# Patient Record
Sex: Female | Born: 1955
Health system: Southern US, Community
[De-identification: ages and names within clinical notes are randomized; demographics above are authoritative.]

## PROBLEM LIST (undated history)

## (undated) DIAGNOSIS — T7840XA Allergy, unspecified, initial encounter: Secondary | ICD-10-CM

## (undated) DIAGNOSIS — K219 Gastro-esophageal reflux disease without esophagitis: Secondary | ICD-10-CM

## (undated) DIAGNOSIS — Z860100 Personal history of colon polyps, unspecified: Secondary | ICD-10-CM

## (undated) DIAGNOSIS — C73 Malignant neoplasm of thyroid gland: Secondary | ICD-10-CM

## (undated) HISTORY — DX: Personal history of colon polyps, unspecified: Z86.0100

## (undated) HISTORY — PX: UPPER GI ENDOSCOPY: SHX6162

## (undated) HISTORY — PX: COLONOSCOPY: SHX174

## (undated) HISTORY — DX: Malignant neoplasm of thyroid gland: C73

## (undated) HISTORY — DX: Personal history of colonic polyps: Z86.010

## (undated) HISTORY — DX: Gastro-esophageal reflux disease without esophagitis: K21.9

## (undated) HISTORY — DX: Allergy, unspecified, initial encounter: T78.40XA

---

## 1991-12-22 HISTORY — PX: THYROID SURGERY: SHX805

## 2000-05-25 ENCOUNTER — Other Ambulatory Visit: Admission: RE | Admit: 2000-05-25 | Discharge: 2000-05-25 | Payer: Self-pay | Admitting: Obstetrics and Gynecology

## 2000-09-21 ENCOUNTER — Encounter: Admission: RE | Admit: 2000-09-21 | Discharge: 2000-09-21 | Payer: Self-pay | Admitting: *Deleted

## 2001-04-21 ENCOUNTER — Encounter: Admission: RE | Admit: 2001-04-21 | Discharge: 2001-04-21 | Payer: Self-pay | Admitting: Endocrinology

## 2001-04-21 ENCOUNTER — Encounter: Payer: Self-pay | Admitting: Endocrinology

## 2002-11-07 ENCOUNTER — Other Ambulatory Visit: Admission: RE | Admit: 2002-11-07 | Discharge: 2002-11-07 | Payer: Self-pay | Admitting: Obstetrics and Gynecology

## 2003-01-01 ENCOUNTER — Ambulatory Visit (HOSPITAL_COMMUNITY): Admission: RE | Admit: 2003-01-01 | Discharge: 2003-01-01 | Payer: Self-pay | Admitting: Internal Medicine

## 2003-01-03 ENCOUNTER — Encounter (HOSPITAL_COMMUNITY): Admission: RE | Admit: 2003-01-03 | Discharge: 2003-04-03 | Payer: Self-pay | Admitting: Internal Medicine

## 2003-01-05 ENCOUNTER — Encounter (HOSPITAL_BASED_OUTPATIENT_CLINIC_OR_DEPARTMENT_OTHER): Payer: Self-pay | Admitting: Internal Medicine

## 2003-03-12 ENCOUNTER — Ambulatory Visit (HOSPITAL_COMMUNITY): Admission: RE | Admit: 2003-03-12 | Discharge: 2003-03-12 | Payer: Self-pay | Admitting: Obstetrics and Gynecology

## 2003-03-12 ENCOUNTER — Encounter (INDEPENDENT_AMBULATORY_CARE_PROVIDER_SITE_OTHER): Payer: Self-pay

## 2004-02-20 ENCOUNTER — Other Ambulatory Visit: Admission: RE | Admit: 2004-02-20 | Discharge: 2004-02-20 | Payer: Self-pay | Admitting: Obstetrics and Gynecology

## 2005-04-22 ENCOUNTER — Other Ambulatory Visit: Admission: RE | Admit: 2005-04-22 | Discharge: 2005-04-22 | Payer: Self-pay | Admitting: Obstetrics and Gynecology

## 2006-10-21 ENCOUNTER — Ambulatory Visit (HOSPITAL_COMMUNITY): Admission: RE | Admit: 2006-10-21 | Discharge: 2006-10-21 | Payer: Self-pay | Admitting: *Deleted

## 2007-03-28 ENCOUNTER — Encounter (HOSPITAL_COMMUNITY): Admission: RE | Admit: 2007-03-28 | Discharge: 2007-04-01 | Payer: Self-pay | Admitting: Internal Medicine

## 2008-01-12 ENCOUNTER — Encounter: Admission: RE | Admit: 2008-01-12 | Discharge: 2008-01-12 | Payer: Self-pay | Admitting: Obstetrics and Gynecology

## 2008-12-15 ENCOUNTER — Emergency Department (HOSPITAL_COMMUNITY): Admission: EM | Admit: 2008-12-15 | Discharge: 2008-12-15 | Payer: Self-pay | Admitting: Family Medicine

## 2009-02-20 ENCOUNTER — Ambulatory Visit (HOSPITAL_COMMUNITY): Admission: RE | Admit: 2009-02-20 | Discharge: 2009-02-20 | Payer: Self-pay | Admitting: *Deleted

## 2009-02-20 ENCOUNTER — Encounter (INDEPENDENT_AMBULATORY_CARE_PROVIDER_SITE_OTHER): Payer: Self-pay | Admitting: *Deleted

## 2009-03-13 ENCOUNTER — Encounter: Admission: RE | Admit: 2009-03-13 | Discharge: 2009-03-13 | Payer: Self-pay | Admitting: Obstetrics and Gynecology

## 2009-07-30 ENCOUNTER — Encounter: Admission: RE | Admit: 2009-07-30 | Discharge: 2009-07-30 | Payer: Self-pay | Admitting: Internal Medicine

## 2009-09-30 ENCOUNTER — Encounter (HOSPITAL_COMMUNITY): Admission: RE | Admit: 2009-09-30 | Discharge: 2009-10-04 | Payer: Self-pay | Admitting: Endocrinology

## 2010-02-05 ENCOUNTER — Encounter: Admission: RE | Admit: 2010-02-05 | Discharge: 2010-02-05 | Payer: Self-pay | Admitting: Internal Medicine

## 2010-02-24 ENCOUNTER — Encounter (INDEPENDENT_AMBULATORY_CARE_PROVIDER_SITE_OTHER): Payer: Self-pay | Admitting: *Deleted

## 2010-02-26 ENCOUNTER — Ambulatory Visit: Payer: Self-pay | Admitting: Gastroenterology

## 2010-03-12 ENCOUNTER — Ambulatory Visit: Payer: Self-pay | Admitting: Gastroenterology

## 2010-03-14 ENCOUNTER — Encounter: Payer: Self-pay | Admitting: Gastroenterology

## 2010-03-18 ENCOUNTER — Encounter: Admission: RE | Admit: 2010-03-18 | Discharge: 2010-03-18 | Payer: Self-pay | Admitting: Obstetrics and Gynecology

## 2010-08-04 ENCOUNTER — Encounter: Admission: RE | Admit: 2010-08-04 | Discharge: 2010-08-04 | Payer: Self-pay | Admitting: Internal Medicine

## 2010-11-03 ENCOUNTER — Encounter (HOSPITAL_COMMUNITY)
Admission: RE | Admit: 2010-11-03 | Discharge: 2011-01-20 | Payer: Self-pay | Source: Home / Self Care | Attending: Endocrinology | Admitting: Endocrinology

## 2011-01-11 ENCOUNTER — Encounter: Payer: Self-pay | Admitting: Obstetrics and Gynecology

## 2011-01-11 ENCOUNTER — Encounter: Payer: Self-pay | Admitting: Internal Medicine

## 2011-01-22 NOTE — Letter (Signed)
Summary: [facility] Instructions  [facility]  [address] [place], [place] [address]   Phone: [phone]  Fax: [phone]       Brianna Mcintyre    1956-07-27    MRN: [id number]        Procedure Day Dorna Bloom:  Wednesday 03/12/2010     Arrival Time: 8:00 am      Procedure Time: 9:00 am     Location of Procedure:                    _x _   Endoscopy Center (4th Floor)                        PREPARATION FOR COLONOSCOPY WITH MOVIPREP   Starting 5 days prior to your procedure Friday 3/18 do not eat nuts, seeds, popcorn, corn, beans, peas,  salads, or any raw vegetables.  Do not take any fiber supplements (e.g. Metamucil, Citrucel, and Benefiber).  THE DAY BEFORE YOUR PROCEDURE         DATE: Tuesday 3/22  1.  Drink clear liquids the entire day-NO SOLID FOOD  2.  Do not drink anything colored red or purple.  Avoid juices with pulp.  No orange juice.  3.  Drink at least 64 oz. (8 glasses) of fluid/clear liquids during the day to prevent dehydration and help the prep work efficiently.  CLEAR LIQUIDS INCLUDE: Water Jello Ice Popsicles Tea (sugar ok, no milk/cream) Powdered fruit flavored drinks Coffee (sugar ok, no milk/cream) Gatorade Juice: apple, white grape, white cranberry  Lemonade Clear bullion, consomm, broth Carbonated beverages (any kind) Strained chicken noodle soup Hard Candy                             4.  In the morning, mix first dose of MoviPrep solution:    Empty 1 Pouch A and 1 Pouch B into the disposable container    Add lukewarm drinking water to the top line of the container. Mix to dissolve    Refrigerate (mixed solution should be used within 24 hrs)  5.  Begin drinking the prep at 5:00 p.m. The MoviPrep container is divided by 4 marks.   Every 15 minutes drink the solution down to the next mark (approximately 8 oz) until the full liter is complete.   6.  Follow completed prep with 16 oz of clear liquid of your choice (Nothing  red or purple).  Continue to drink clear liquids until bedtime.  7.  Before going to bed, mix second dose of MoviPrep solution:    Empty 1 Pouch A and 1 Pouch B into the disposable container    Add lukewarm drinking water to the top line of the container. Mix to dissolve    Refrigerate  THE DAY OF YOUR PROCEDURE      DATE: Wednesday 3/23  Beginning at 4:00 a.m. (5 hours before procedure):         1. Every 15 minutes, drink the solution down to the next mark (approx 8 oz) until the full liter is complete.  2. Follow completed prep with 16 oz. of clear liquid of your choice.    3. You may drink clear liquids until 7:00 am (2 HOURS BEFORE PROCEDURE).   MEDICATION INSTRUCTIONS  Unless otherwise instructed, you should take regular prescription medications with a small sip of water   as early as possible the morning of  your procedure.           OTHER INSTRUCTIONS  You will need a responsible adult at least 53 years of age to accompany you and drive you home.   This person must remain in the waiting room during your procedure.  Wear loose fitting clothing that is easily removed.  Leave jewelry and other valuables at home.  However, you may wish to bring a book to read or  an iPod/MP3 player to listen to music as you wait for your procedure to start.  Remove all body piercing jewelry and leave at home.  Total time from sign-in until discharge is approximately 2-3 hours.  You should go home directly after your procedure and rest.  You can resume normal activities the  day after your procedure.  The day of your procedure you should not:   Drive   Make legal decisions   Operate machinery   Drink alcohol   Return to work  You will receive specific instructions about eating, activities and medications before you leave.    The above instructions have been reviewed and explained to me by   Ezra Sites RN  February 26, 2010 8:12 AM     I fully understand and can  verbalize these instructions _____________________________ Date _________

## 2011-01-22 NOTE — Procedures (Signed)
Summary: Colonoscopy  Patient: Brianna Mcintyre Note: All result statuses are Final unless otherwise noted.  Tests: (1) Colonoscopy (COL)   COL Colonoscopy           DONE     [facility]     [address]     [place], [place]  [address]           COLONOSCOPY PROCEDURE REPORT           PATIENT:  Shelbylynn, Walczyk  MR#:  [id number]     BIRTHDATE:  [birth year], 53 yrs. old  GENDER:  female     ENDOSCOPIST:  Rachael Fee, MD     Referred by:  Juline Patch, M.D.     PROCEDURE DATE:  03/12/2010     PROCEDURE:  Colonoscopy with biopsy     ASA CLASS:  Class II     INDICATIONS:  had two polyps removed 1 year ago by Dr. Virginia Rochester;     shape/size not documented; both were adenomatous, the polyp in     rectum had focal HGD, she was told to have repeat colonoscopy at 1     year interval     MEDICATIONS:   Fentanyl 62.5 mcg IV, Versed 7 mg IV           DESCRIPTION OF PROCEDURE:   After the risks benefits and     alternatives of the procedure were thoroughly explained, informed     consent was obtained.  Digital rectal exam was performed and     revealed no rectal masses.   The LB CF-H180AL [id number] endoscope     was introduced through the anus and advanced to the cecum, which     was identified by both the appendix and ileocecal valve, without     limitations.  The quality of the prep was excellent, using     MoviPrep.  The instrument was then slowly withdrawn as the colon     was fully examined.     <<PROCEDUREIMAGES>>     FINDINGS:  Several small, flat, hyperplastic appearing     recto-sigmoid polyps noted. These were sampled with forceps and     sent to pathology (jar 1) (see image3).  Site of rectal polyp     resection was clearly located in proximal rectum (evidence of scar     noted), this site was clear of residual/recurrent adenomatous     mucosa (see image4).  This was otherwise a normal examination of     the colon (see image5, image2, and image1).   Retroflexed views in     the  rectum revealed no abnormalities.    The scope was then     withdrawn from the patient and the procedure completed.     COMPLICATIONS:  None           ENDOSCOPIC IMPRESSION:     1) Several small hyperplastic appearing rectosigmoid polyps,     sampled with forceps, sent to pathology     2) Site of previous rectal polypectomy located, was clear of     residual/recurrent adenomatous mucosa     3) Otherwise normal examination           RECOMMENDATIONS:     Await final biopsies, likely will recommend repeat colonoscopy     at 3 year interval given HGD in polyp that was removed 1 year ago     by Dr. Virginia Rochester.  ______________________________     Rachael Fee, MD           n.     eSIGNED:   Rachael Fee at 03/12/2010 09:29 AM           Okey Regal, [id number]  Note: An exclamation mark (!) indicates a result that was not dispersed into the flowsheet. Document Creation Date: 03/12/2010 9:30 AM _______________________________________________________________________  (1) Order result status: Final Collection or observation date-time: 03/12/2010 09:20 Requested date-time:  Receipt date-time:  Reported date-time:  Referring Physician:   Ordering Physician: Rob Bunting [phone]) Specimen Source:  Source: Launa Grill Order Number: [id number] Lab site:   Appended Document: Colonoscopy recall     Procedures Next Due Date:    Colonoscopy: 02/2013

## 2011-01-22 NOTE — Miscellaneous (Signed)
Summary: [facility]  Clinical Lists Changes  Medications: Added new medication of MOVIPREP 100 GM  SOLR (PEG-KCL-NACL-NASULF-NA ASC-C) As per prep instructions. - Signed Rx of MOVIPREP 100 GM  SOLR (PEG-KCL-NACL-NASULF-NA ASC-C) As per prep instructions.;  #1 x 0;  Signed;  Entered by: Ezra Sites RN;  Authorized by: Rachael Fee MD;  Method used: Electronically to [facility] [address] [place], [place]  [address], Ph: [phone] or [phone], Fax: [phone] Allergies: Added new allergy or adverse reaction of CODEINE Observations: Added new observation of NKA: F (02/26/2010 7:37)    Prescriptions: MOVIPREP 100 GM  SOLR (PEG-KCL-NACL-NASULF-NA ASC-C) As per prep instructions.  #1 x 0   Entered by:   Ezra Sites RN   Authorized by:   Rachael Fee MD   Signed by:   Ezra Sites RN on 02/26/2010   Method used:   Electronically to        [facility] (retail)       [address]       [place], [place]  [address]       Ph: [phone] or [phone]       Fax: [phone]   RxID:   [id number]

## 2011-01-22 NOTE — Letter (Signed)
Summary: Results Letter  [facility]  [address] [place], [place] [address]   Phone: [phone]  Fax: [phone]        March 14, 2010 MRN: [id number]    [facility] [address] [place], [place]  [address]     Dear Ms. Deisher,    Good news.  The polyp(s) that were removed during your recent procedure were NOT pre-cancerous.  However given your previous pre-cancerous polyps removed in 2010 by Dr. Virginia Rochester, we will put your information in our reminder system and will contact you in 3 years to schedule a repeat procedure.  Please call if you have any questions or concerns.     Sincerely,  Rachael Fee MD  This letter has been electronically signed by your physician.  Appended Document: Results Letter Letter mailed 3.25.11

## 2011-05-05 NOTE — Op Note (Signed)
[name], Brianna Mcintyre                 ACCOUNT NO.:  [id number]   MEDICAL RECORD NO.:  [id number]          PATIENT TYPE:  AMB   LOCATION:  ENDO                         FACILITY:  [facility]   PHYSICIAN:  Georgiana Spinner, M.D.    DATE OF BIRTH:  [birth year]   DATE OF PROCEDURE:  02/20/2009  DATE OF DISCHARGE:                               OPERATIVE REPORT   PROCEDURE:  Colonoscopy.   INDICATIONS:  Colon polyps, colon cancer screening.   ANESTHESIA:  Fentanyl 100 mcg, Versed 12 mg.   PROCEDURE:  With the patient mildly sedated in the left lateral  decubitus position, the Pentax videoscopic pediatric colonoscope was  inserted in the rectum and passed under direct vision with pressure  applied to reach the cecum, identified by ileocecal valve and  appendiceal orifice, both of which were photographed.  From this point  the colonoscope was slowly withdrawn taking circumferential views of  colonic mucosa, stopping at 40 cm from anal verge at which point a polyp  was seen, photographed and removed using snare cautery technique setting  of 20/150 blended current.  The polyp was retrieved by suctioning it  through the endoscope into a tissue trap.  We then further withdrew the  colonoscope taking circumferential views of remaining colonic mucosa as  we withdrew to the rectum where a second polyp was seen.  It too was  photographed and removed using snare cautery technique with the same  setting and it too was removed by suctioning into the end of the  endoscope and pulling it through for retrieval.  The endoscope was  reinserted to this point and drawn back to the rectum which appeared  normal on direct and normal on retroflexed view as well.  The endoscope  was straightened and withdrawn.  The patient's vital signs, pulse  oximeter remained stable.  The patient tolerated procedure well without  apparent complications.   FINDINGS:  Polyp of 40 cm from anal verge and in the rectum proximally.  Await  biopsy report.  The patient will call me for results and follow up  with me as an outpatient.           ______________________________  Georgiana Spinner, M.D.     GMO/MEDQ  D:  02/20/2009  T:  02/20/2009  Job:  [id number]

## 2011-05-08 NOTE — Op Note (Signed)
   NAME:  Brianna Mcintyre, Brianna Mcintyre                           ACCOUNT NO.:  [id number]   MEDICAL RECORD NO.:  [id number]                   PATIENT TYPE:  AMB   LOCATION:  SDC                                  FACILITY:  [facility]   PHYSICIAN:  Juluis Mire, M.D.                DATE OF BIRTH:  1956-09-06   DATE OF PROCEDURE:  03/12/2003  DATE OF DISCHARGE:                                 OPERATIVE REPORT   PREOPERATIVE DIAGNOSES:  Postmenopausal bleeding.   POSTOPERATIVE DIAGNOSES:  Postmenopausal bleeding.   OPERATIVE PROCEDURE:  1. Hysteroscopy.  2. Endometrial biopsies.  3. Endometrial curettings.   SURGEON:  Juluis Mire, M.D.   ANESTHESIA:  General.   ESTIMATED BLOOD LOSS:  Minimal.   PACKS AND DRAINS:  None.   INTRAOPERATIVE BLOOD PLACED:  None.   COMPLICATIONS:  None.   INDICATIONS:  Dictated in history and physical.   PROCEDURE:  The patient was taken to OR and placed in supine position.  After a satisfactory level of general anesthesia obtained, the patient was  placed in the dorsal lithotomy position using Allen stirrups.  The abdomen,  perineum, and vagina were prepped out with Hibiclens.  The patient was then  draped out in sterile field.  Speculum was placed in the vaginal vault.  The  cervix grasped with single tooth tenaculum.  Uterus sounded approximately 8  cm.  Cervix serially dilated to a size 37 Pratt dilator.  Operative  hysteroscope was introduced.  Visualization revealed normal endometrial  findings.  There were no polyps, excrescences, or solid areas.  Multiple  endometrial biopsies were obtained from the anterior, posterior, and lateral  walls.  Endometrial curettings were also obtained.  There was no perforation  or signs of complications.  No active bleeding was encountered.  Single  tooth tenaculum and speculum then removed.  The patient taken out of the  dorsal lithotomy.  Once alert and extubated transferred to recovery room in  good condition.  Sponge,  instrument, needle count reported as correct by  circulating nurse.                                               Juluis Mire, M.D.    JSM/MEDQ  D:  03/12/2003  T:  03/12/2003  Job:  [id number]

## 2011-05-08 NOTE — H&P (Signed)
NAME:  Brianna Mcintyre, Brianna Mcintyre                           ACCOUNT NO.:  [id number]   MEDICAL RECORD NO.:  [id number]                   PATIENT TYPE:  AMB   LOCATION:  SDC                                  FACILITY:  [facility]   PHYSICIAN:  Juluis Mire, M.D.                DATE OF BIRTH:  June 12, 1956   DATE OF ADMISSION:  DATE OF DISCHARGE:                                HISTORY & PHYSICAL   HISTORY OF PRESENT ILLNESS:  A 55 year old nulligravida female who presents  for hysteroscopic evaluation.   In relation to the present admission patient has been troubled with  postmenopausal bleeding.  Her FSHs have been elevated consistent with  menopausal status.  She has reported recurrent bleeding episodes.  We  attempted a saline infusion ultrasound here in the office and biopsy.  On  ultrasound everything appeared to be normal.  We were unable to do the  saline infusion or endometrial sampling due to cervical stenosis.  In view  of this we have set up hysteroscopic evaluation to rule out any type of  endometrial pathology.   ALLERGIES:  CODEINE and BIAXIN.   MEDICATIONS:  Synthroid.   PAST MEDICAL HISTORY:  Usual childhood diseases without significant  sequelae.  Did have a history of thyroid cancer with partial thyroidectomy  in 1993.  Did receive radiation after and is presently on Synthroid  replacement with no evidence of recurrent disease.  She has had no other  surgical or obstetrical history.   FAMILY HISTORY:  Paternal grandfather had a history of hypertension as well  as emphysema.  Brother has a history of kidney problems.  There is one  maternal grandmother with breast cancer.   SOCIAL HISTORY:  No tobacco or alcohol use.   REVIEW OF SYSTEMS:  Noncontributory.   PHYSICAL EXAMINATION:  VITAL SIGNS:  The patient is afebrile with stable  vital signs.  HEENT:  The patient is normocephalic.  Pupils are equal, round, and reactive  to light and accommodation.  Extraocular movements are  intact.  Sclerae and  conjunctivae clear.  Oropharynx clear.  NECK:  Without thyromegaly.  BREASTS:  No discreet masses.  LUNGS:  Clear.  CARDIOVASCULAR:  Regular rhythm, rate without murmurs or gallops.  ABDOMEN:  Benign.  No mass, organomegaly, or tenderness.  PELVIC:  Normal external genitalia.  Vaginal mucosa is clear.  Cervix  unremarkable.  Uterus normal size, shape, and contour.  Adnexa free of  masses or tenderness.  EXTREMITIES:  Trace edema.  NEUROLOGIC:  Grossly within normal limits.   IMPRESSION:  Postmenopausal bleeding, rule out endometrial pathology.   PLAN:  The patient will undergo hysteroscopic evaluation with resectoscope.  We have discussed the indications to rule out endometrial pathology.  The  risks of surgery have been discussed including the risk of infection, risk  of  vascular injury that could lead to hemorrhage requiring transfusion or  possible  hysterectomy, risk of injury to adjacent organs through perforation  that could require laparoscopy, possible exploratory laparotomy, risk of  deep venous thrombosis, and pulmonary embolus.  The patient expressed  understanding of the indications and risks.                                               Juluis Mire, M.D.    JSM/MEDQ  D:  03/12/2003  T:  03/12/2003  Job:  [id number]

## 2011-05-08 NOTE — Op Note (Signed)
[name], Brianna Mcintyre                 ACCOUNT NO.:  [id number]   MEDICAL RECORD NO.:  [id number]          PATIENT TYPE:  AMB   LOCATION:  ENDO                         FACILITY:  [facility]   PHYSICIAN:  Georgiana Spinner, M.D.    DATE OF BIRTH:  1956/07/12   DATE OF PROCEDURE:  10/21/2006  DATE OF DISCHARGE:                                 OPERATIVE REPORT   PROCEDURE:  Upper endoscopy.   INDICATIONS:  GERD.   ANESTHESIA:  Demerol 70 and Versed 8 mg.   PROCEDURE:  With the patient mildly sedated in the left lateral decubitus  position, the Olympus videoscopic endoscope was inserted in the mouth and  passed under direct vision through the esophagus which appeared normal.  There was no evidence of Barrett's esophagus or esophagitis.  We entered  into the stomach.  Fundus, body and antrum, duodenal bulb, second portion of  duodenum were visualized.  From this point, the endoscope was slowly  withdrawn taking circumferential views of duodenal mucosa until the  endoscope had been pulled back into the stomach and placed in retroflexion  to view the stomach from below.  The endoscope was straightened and  withdrawn taking circumferential views of the remaining gastric and  esophageal mucosa.  The patient's vital signs and pulse oximeter remained  stable.  The patient tolerated the procedure well without apparent  complications.   FINDINGS:  Rather unremarkable examination.   PLAN:  Await outpatient patient follow-up.           ______________________________  Georgiana Spinner, M.D.     GMO/MEDQ  D:  10/21/2006  T:  10/21/2006  Job:  [id number]

## 2011-10-13 ENCOUNTER — Other Ambulatory Visit: Payer: Self-pay | Admitting: Obstetrics and Gynecology

## 2011-10-13 DIAGNOSIS — Z1231 Encounter for screening mammogram for malignant neoplasm of breast: Secondary | ICD-10-CM

## 2011-10-27 ENCOUNTER — Ambulatory Visit
Admission: RE | Admit: 2011-10-27 | Discharge: 2011-10-27 | Disposition: A | Discharge status: Home / Self Care | Payer: BC Managed Care – PPO | Source: Ambulatory Visit | Attending: Obstetrics and Gynecology | Admitting: Obstetrics and Gynecology

## 2011-10-27 DIAGNOSIS — Z1231 Encounter for screening mammogram for malignant neoplasm of breast: Secondary | ICD-10-CM

## 2011-10-30 ENCOUNTER — Other Ambulatory Visit: Payer: Self-pay | Admitting: Obstetrics and Gynecology

## 2011-10-30 DIAGNOSIS — R928 Other abnormal and inconclusive findings on diagnostic imaging of breast: Secondary | ICD-10-CM

## 2011-11-10 ENCOUNTER — Ambulatory Visit
Admission: RE | Admit: 2011-11-10 | Discharge: 2011-11-10 | Disposition: A | Discharge status: Home / Self Care | Payer: BC Managed Care – PPO | Source: Ambulatory Visit | Attending: Obstetrics and Gynecology | Admitting: Obstetrics and Gynecology

## 2011-11-10 DIAGNOSIS — R928 Other abnormal and inconclusive findings on diagnostic imaging of breast: Secondary | ICD-10-CM

## 2011-11-17 ENCOUNTER — Other Ambulatory Visit: Payer: BC Managed Care – PPO

## 2012-04-28 ENCOUNTER — Other Ambulatory Visit: Payer: Self-pay | Admitting: Internal Medicine

## 2012-05-03 ENCOUNTER — Ambulatory Visit
Admission: RE | Admit: 2012-05-03 | Discharge: 2012-05-03 | Disposition: A | Discharge status: Home / Self Care | Payer: BC Managed Care – PPO | Source: Ambulatory Visit | Attending: Internal Medicine | Admitting: Internal Medicine

## 2012-05-10 ENCOUNTER — Encounter: Payer: Self-pay | Admitting: Gastroenterology

## 2012-06-07 ENCOUNTER — Encounter: Payer: Self-pay | Admitting: Gastroenterology

## 2012-06-07 ENCOUNTER — Ambulatory Visit (INDEPENDENT_AMBULATORY_CARE_PROVIDER_SITE_OTHER): Payer: BC Managed Care – PPO | Admitting: Gastroenterology

## 2012-06-07 VITALS — BP 112/76 | HR 78 | Ht 63.0 in | Wt 221.4 lb

## 2012-06-07 DIAGNOSIS — R933 Abnormal findings on diagnostic imaging of other parts of digestive tract: Secondary | ICD-10-CM

## 2012-06-07 DIAGNOSIS — R1310 Dysphagia, unspecified: Secondary | ICD-10-CM

## 2012-06-07 NOTE — Patient Instructions (Addendum)
You will be set up for an upper endoscopy, for narrowing on barium esophagus, swallowing problem.

## 2012-06-07 NOTE — Progress Notes (Signed)
Review of pertinent gastrointestinal problems: 1. had two polyps removed 2010 by Dr. Virginia Rochester; shape/size not documented; both were adenomatous, the polyp in rectum had focal HGD, she was told to have repeat colonoscopy at 1 year interval.  Repeat colonoscopy Dr. Christella Hartigan March 2011 found several diminutive hyperplastic polyps. Recommended repeat colonoscopy at 3 year interval.   HPI: This is a  a very pleasant 56 year old woman whom I last saw about 2 years ago. Hard to explain.  She had a quivering senstation in stomach and in throat.  SHe has intermittent epigastric pain, tenderness.  Mentioned the quivering a lot.   Feels a fluttering, quivering sensation.    She went to [facility] and she had a barium esohagram last month. I reviewed the report as well as the images..  SMooth stricture noted on barium esophagram.  She does have intermittent dysphagia. About daily.  Eating smaller bites, chewing well.   Overall her weight has been stable despite attempts to lose.   She has pyrosis, takes aciphex daily with good relief of symptoms.    Cold foods, liquids irritate her stomach.   Review of systems: Pertinent positive and negative review of systems were noted in the above HPI section. Complete review of systems was performed and was otherwise normal.    Past Medical History  Diagnosis Date  . History of colon polyps     No past surgical history on file.  Current Outpatient Prescriptions  Medication Sig Dispense Refill  . levothyroxine (SYNTHROID) 25 MCG tablet Take 25 mcg by mouth daily.      . RABEprazole (ACIPHEX) 20 MG tablet Take 20 mg by mouth daily.        Allergies as of 06/07/2012 - Review Complete 06/07/2012  Allergen Reaction Noted  . Codeine  02/26/2010    No family history on file.  History   Social History  . Marital Status: Divorced    Spouse Name: N/A    Number of Children: 4  . Years of Education: N/A   Occupational History  . DIRECTOR OF SALES     Social History Main Topics  . Smoking status: Never Smoker   . Smokeless tobacco: Never Used  . Alcohol Use: Yes     rarely  . Drug Use: No  . Sexually Active: Not on file   Other Topics Concern  . Not on file   Social History Narrative  . No narrative on file       Physical Exam: BP 112/76  Pulse 78  Ht 5\' 3"  (1.6 m)  Wt 221 lb 6.4 oz (100.426 kg)  BMI 39.22 kg/m2 Constitutional: generally well-appearing Psychiatric: alert and oriented x3 Eyes: extraocular movements intact Mouth: oral pharynx moist, no lesions Neck: supple no lymphadenopathy Cardiovascular: heart regular rate and rhythm Lungs: clear to auscultation bilaterally Abdomen: soft, nontender, nondistended, no obvious ascites, no peritoneal signs, normal bowel sounds Extremities: no lower extremity edema bilaterally Skin: no lesions on visible extremities    Assessment and plan: 56 y.o. female with  quivering sensation in her epigastrium, intermittent dysphasia, smooth narrowing in the esophagus  Perhaps a peptic stricture, perhaps achalasia. I reassured that the quivering sensation is not coming from her heart. It might be from her esophagus or also possible is musculoskeletal irritation. We will proceed with EGD at her soonest convenience to check the narrow area, dilated needed.

## 2012-06-08 ENCOUNTER — Encounter: Payer: Self-pay | Admitting: Gastroenterology

## 2012-06-08 ENCOUNTER — Ambulatory Visit (AMBULATORY_SURGERY_CENTER): Payer: BC Managed Care – PPO | Admitting: Gastroenterology

## 2012-06-08 VITALS — BP 135/93 | HR 77 | Temp 98.5°F | Resp 16 | Ht 63.0 in | Wt 221.0 lb

## 2012-06-08 DIAGNOSIS — K222 Esophageal obstruction: Secondary | ICD-10-CM

## 2012-06-08 DIAGNOSIS — R1310 Dysphagia, unspecified: Secondary | ICD-10-CM

## 2012-06-08 DIAGNOSIS — R933 Abnormal findings on diagnostic imaging of other parts of digestive tract: Secondary | ICD-10-CM

## 2012-06-08 MED ORDER — SODIUM CHLORIDE 0.9 % IV SOLN: 500.0000 mL | INTRAVENOUS | Status: DC

## 2012-06-08 NOTE — Patient Instructions (Addendum)
YOU HAD AN ENDOSCOPIC PROCEDURE TODAY AT THE [facility]: Refer to the procedure report that was given to you for any specific questions about what was found during the examination.  If the procedure report does not answer your questions, please call your gastroenterologist to clarify.  If you requested that your care partner not be given the details of your procedure findings, then the procedure report has been included in a sealed envelope for you to review at your convenience later.  YOU SHOULD EXPECT: Some feelings of bloating in the abdomen. Passage of more gas than usual.  Walking can help get rid of the air that was put into your GI tract during the procedure and reduce the bloating. If you had a lower endoscopy (such as a colonoscopy or flexible sigmoidoscopy) you may notice spotting of blood in your stool or on the toilet paper. If you underwent a bowel prep for your procedure, then you may not have a normal bowel movement for a few days.   DIET: DILATION PROTOCOL   ACTIVITY: Your care partner should take you home directly after the procedure.  You should plan to take it easy, moving slowly for the rest of the day.  You can resume normal activity the day after the procedure however you should NOT DRIVE or use heavy machinery for 24 hours (because of the sedation medicines used during the test).    SYMPTOMS TO REPORT IMMEDIATELY: A gastroenterologist can be reached at any hour.  During normal business hours, 8:30 AM to 5:00 PM Monday through Friday, call [phone].  After hours and on weekends, please call the GI answering service at [phone] who will take a message and have the physician on call contact you.  Resume medications. Dilation protocol given with discharge instructions.   Following upper endoscopy (EGD)  Vomiting of blood or coffee ground material  New chest pain or pain under the shoulder blades  Painful or persistently difficult swallowing  New  shortness of breath  Fever of 100F or higher  Black, tarry-looking stools  FOLLOW UP: If any biopsies were taken you will be contacted by phone or by letter within the next 1-3 weeks.  Call your gastroenterologist if you have not heard about the biopsies in 3 weeks.  Our staff will call the home number listed on your records the next business day following your procedure to check on you and address any questions or concerns that you may have at that time regarding the information given to you following your procedure. This is a courtesy call and so if there is no answer at the home number and we have not heard from you through the emergency physician on call, we will assume that you have returned to your regular daily activities without incident.  SIGNATURES/CONFIDENTIALITY: You and/or your care partner have signed paperwork which will be entered into your electronic medical record.  These signatures attest to the fact that that the information above on your After Visit Summary has been reviewed and is understood.  Full responsibility of the confidentiality of this discharge information lies with you and/or your care-partner.

## 2012-06-08 NOTE — Progress Notes (Signed)
Patient did not experience any of the following events: a burn prior to discharge; a fall within the facility; wrong site/side/patient/procedure/implant event; or a hospital transfer or hospital admission upon discharge from the facility. (G8907) Patient did not have preoperative order for IV antibiotic SSI prophylaxis. (G8918)  

## 2012-06-08 NOTE — Op Note (Signed)
[facility] [address] [place], [place]  [address]  ENDOSCOPY PROCEDURE REPORT  PATIENT:  Brianna, Mcintyre  MR#:  [id number] BIRTHDATE:  Jun 14, 1956, 56 yrs. old  GENDER:  female ENDOSCOPIST:  Rachael Fee, MD PROCEDURE DATE:  06/08/2012 PROCEDURE:  EGD with balloon dilatation ASA CLASS:  Class II INDICATIONS:  intermittent dysphagia, fluttering sensation near GE junction, Barium esophagram showing smooth narrowing at GE junction MEDICATIONS:  Fentanyl 75 mcg IV, These medications were titrated to patient response per physician's verbal order, Versed 7 mg IV TOPICAL ANESTHETIC:  Cetacaine Spray  DESCRIPTION OF PROCEDURE:   After the risks benefits and alternatives of the procedure were thoroughly explained, informed consent was obtained.  The LB [id number] endoscope was introduced through the mouth and advanced to the second portion of the duodenum, without limitations.  The instrument was slowly withdrawn as the mucosa was fully examined. <<PROCEDUREIMAGES>> Slight narrowing at GE junction, smooth, clearly benign. This was dilated with CRE TTS balloon held inflated for 60 seconds at 20mm (see image5 and image6).  Otherwise the examination was normal (see image1, image2, image3, and image4).   Retroflexed views revealed no abnormalities.    The scope was then withdrawn from the patient and the procedure completed. COMPLICATIONS:  None  ENDOSCOPIC IMPRESSION: Slight narrowing at GE junction, smooth and benign appearing. Dilated  RECOMMENDATIONS: Please call Dr. Christella Hartigan' office in 3-4 weeks to report on your swallowing sensation, fluttering.  If still present then will likely proceed with esophageal manometry test.  ______________________________ Rachael Fee, MD  cc: Juline Patch, MD  n. eSIGNED:   Rachael Fee at 06/08/2012 01:41 PM  Okey Regal, [phone]

## 2012-06-09 ENCOUNTER — Telehealth: Payer: Self-pay | Admitting: *Deleted

## 2012-06-09 NOTE — Telephone Encounter (Signed)
  Follow up Call-  Call back number 06/08/2012  Post procedure Call Back phone  # [phone]  Permission to leave phone message Yes     Patient questions:  Do you have a fever, pain , or abdominal swelling? no Pain Score  0 *  Have you tolerated food without any problems? yes  Have you been able to return to your normal activities? yes  Do you have any questions about your discharge instructions: Diet   no Medications  no Follow up visit  no  Do you have questions or concerns about your Care? no  Actions: * If pain score is 4 or above: No action needed, pain <4.

## 2012-10-06 ENCOUNTER — Other Ambulatory Visit: Payer: Self-pay | Admitting: Obstetrics and Gynecology

## 2012-10-06 DIAGNOSIS — Z1231 Encounter for screening mammogram for malignant neoplasm of breast: Secondary | ICD-10-CM

## 2012-11-02 ENCOUNTER — Ambulatory Visit
Admission: RE | Admit: 2012-11-02 | Discharge: 2012-11-02 | Disposition: A | Discharge status: Home / Self Care | Payer: BC Managed Care – PPO | Source: Ambulatory Visit | Attending: Obstetrics and Gynecology | Admitting: Obstetrics and Gynecology

## 2012-11-02 DIAGNOSIS — Z1231 Encounter for screening mammogram for malignant neoplasm of breast: Secondary | ICD-10-CM

## 2013-01-27 ENCOUNTER — Encounter: Payer: Self-pay | Admitting: Gastroenterology

## 2013-02-02 ENCOUNTER — Emergency Department (HOSPITAL_COMMUNITY): Payer: BC Managed Care – PPO

## 2013-02-02 ENCOUNTER — Emergency Department (HOSPITAL_COMMUNITY)
Admission: EM | Admit: 2013-02-02 | Discharge: 2013-02-02 | Disposition: A | Discharge status: Home / Self Care | Payer: BC Managed Care – PPO | Attending: Emergency Medicine | Admitting: Emergency Medicine

## 2013-02-02 ENCOUNTER — Encounter (HOSPITAL_COMMUNITY): Payer: Self-pay | Admitting: Emergency Medicine

## 2013-02-02 DIAGNOSIS — Z860100 Personal history of colon polyps, unspecified: Secondary | ICD-10-CM | POA: Insufficient documentation

## 2013-02-02 DIAGNOSIS — Y929 Unspecified place or not applicable: Secondary | ICD-10-CM | POA: Insufficient documentation

## 2013-02-02 DIAGNOSIS — Y939 Activity, unspecified: Secondary | ICD-10-CM | POA: Insufficient documentation

## 2013-02-02 DIAGNOSIS — S82409A Unspecified fracture of shaft of unspecified fibula, initial encounter for closed fracture: Secondary | ICD-10-CM

## 2013-02-02 DIAGNOSIS — Z85850 Personal history of malignant neoplasm of thyroid: Secondary | ICD-10-CM | POA: Insufficient documentation

## 2013-02-02 DIAGNOSIS — S82899A Other fracture of unspecified lower leg, initial encounter for closed fracture: Secondary | ICD-10-CM | POA: Insufficient documentation

## 2013-02-02 DIAGNOSIS — X500XXA Overexertion from strenuous movement or load, initial encounter: Secondary | ICD-10-CM | POA: Insufficient documentation

## 2013-02-02 DIAGNOSIS — W172XXA Fall into hole, initial encounter: Secondary | ICD-10-CM | POA: Insufficient documentation

## 2013-02-02 DIAGNOSIS — Z79899 Other long term (current) drug therapy: Secondary | ICD-10-CM | POA: Insufficient documentation

## 2013-02-02 MED ORDER — ONDANSETRON HCL 4 MG PO TABS: 4.0000 mg | ORAL_TABLET | Freq: Four times a day (QID) | ORAL | Status: DC

## 2013-02-02 MED ORDER — OXYCODONE-ACETAMINOPHEN 5-325 MG PO TABS
2.0000 | ORAL_TABLET | Freq: Once | ORAL | Status: AC
  Administered 2013-02-02: 2 via ORAL
  Filled 2013-02-02: qty 2

## 2013-02-02 MED ORDER — OXYCODONE-ACETAMINOPHEN 5-325 MG PO TABS: 1.0000 | ORAL_TABLET | ORAL | Status: DC | PRN

## 2013-02-02 MED ORDER — ONDANSETRON 4 MG PO TBDP
4.0000 mg | ORAL_TABLET | Freq: Once | ORAL | Status: AC
  Administered 2013-02-02: 4 mg via ORAL
  Filled 2013-02-02: qty 1

## 2013-02-02 NOTE — ED Provider Notes (Signed)
History    Brianna Mcintyre with L ankle pain. Pt twisted ankle just shortly before arrival. Persistent pain since. No numbness or tingling. Able to partially bear weight on it. No other complaints. No intervention prior to arrival aside from icing.   CSN: [id number]  Arrival date & time 02/02/13  1127   First MD Initiated Contact with Patient 02/02/13 1131      Chief Complaint  Patient presents with  . Ankle Injury    Left    (Consider location/radiation/quality/duration/timing/severity/associated sxs/prior treatment) HPI  Past Medical History  Diagnosis Date  . History of colon polyps   . Thyroid cancer     Past Surgical History  Procedure Laterality Date  . Thyroid surgery  1993    Family History  Problem Relation Age of Onset  . Thyroid cancer Mother   . Heart disease Mother     History  Substance Use Topics  . Smoking status: Never Smoker   . Smokeless tobacco: Never Used  . Alcohol Use: Yes     Comment: rarely    OB History   Grav Para Term Preterm Abortions TAB SAB Ect Mult Living                  Review of Systems  All systems reviewed and negative, other than as noted in HPI.   Allergies  Clarithromycin and Codeine  Home Medications   Current Outpatient Rx  Name  Route  Sig  Dispense  Refill  . azithromycin (ZITHROMAX) 250 MG tablet   Oral   Take 250 mg by mouth daily. Pt's last dose is today for a full therapy of 5 days         . levothyroxine (SYNTHROID, LEVOTHROID) 137 MCG tablet   Oral   Take 137 mcg by mouth daily.         . RABEprazole (ACIPHEX) 20 MG tablet   Oral   Take 20 mg by mouth daily.         Marland Kitchen oxyCODONE-acetaminophen (PERCOCET/ROXICET) 5-325 MG per tablet   Oral   Take 1-2 tablets by mouth every 4 (four) hours as needed for pain.   20 tablet   0     BP 159/94  Pulse 98  Temp(Src) 97.9 F (36.6 C) (Oral)  Resp 18  SpO2 98%  Physical Exam  Nursing note and vitals reviewed. Constitutional: She appears  well-developed and well-nourished. No distress.  HENT:  Head: Normocephalic and atraumatic.  Eyes: Conjunctivae are normal. Right eye exhibits no discharge. Left eye exhibits no discharge.  Neck: Neck supple.  Cardiovascular: Normal rate, regular rhythm and normal heart sounds.  Exam reveals no gallop and no friction rub.   No murmur heard. Pulmonary/Chest: Effort normal and breath sounds normal. No respiratory distress.  Abdominal: Soft. She exhibits no distension. There is no tenderness.  Musculoskeletal: She exhibits no edema and no tenderness.  Tenderness over medial and lateral malleolus. Ecchymosis of lateral malleolus. Mild ankle swelling. Closed injury. NVI distally.   Neurological: She is alert.  Skin: Skin is warm and dry.  Psychiatric: She has a normal mood and affect. Her behavior is normal. Thought content normal.    ED Course  Procedures (including critical care time)  Labs Reviewed - No data to display Dg Ankle Complete Left  02/02/2013  *RADIOLOGY REPORT*  Clinical Data: Fall.  Ankle pain and swelling.  LEFT ANKLE COMPLETE - 3+ VIEW  Comparison: None.  Findings: Lateral soft tissue swelling is demonstrated.  There is  a nondisplaced spiral fracture of the distal fibular metaphysis, just superior to the level of the tibial plafond.  No other fractures are identified.  No evidence of dislocation.  Ankle mortise remains intact.  Incidental note is made of prominent dorsal plantar calcaneal spurs.  IMPRESSION: Nondisplaced spiral fracture of the distal fibular metaphysis, just superior to the tibial plafond.   Original Report Authenticated By: Myles Rosenthal, M.D.      1. Fibula fracture       MDM  Brianna Mcintyre with L ankle pain. Imaging significant distal fibula fx. Closed injury. NVI distally. PRN pain meds. CAM walker. Ortho FU.         Raeford Razor, MD 02/02/13 [phone]

## 2013-02-02 NOTE — ED Notes (Signed)
Pt reports reports stepping into a hole and twisted her left ankle.

## 2013-11-03 ENCOUNTER — Encounter: Payer: Self-pay | Admitting: Gastroenterology

## 2013-12-05 ENCOUNTER — Other Ambulatory Visit: Payer: Self-pay

## 2013-12-05 ENCOUNTER — Encounter: Payer: Self-pay | Admitting: Gastroenterology

## 2013-12-05 DIAGNOSIS — Z1231 Encounter for screening mammogram for malignant neoplasm of breast: Secondary | ICD-10-CM

## 2014-01-04 ENCOUNTER — Ambulatory Visit
Admission: RE | Admit: 2014-01-04 | Discharge: 2014-01-04 | Disposition: A | Discharge status: Home / Self Care | Payer: BC Managed Care – PPO | Source: Ambulatory Visit

## 2014-01-04 DIAGNOSIS — Z1231 Encounter for screening mammogram for malignant neoplasm of breast: Secondary | ICD-10-CM

## 2014-01-23 ENCOUNTER — Ambulatory Visit (AMBULATORY_SURGERY_CENTER): Payer: Self-pay | Admitting: *Deleted

## 2014-01-23 VITALS — Ht 63.0 in | Wt 226.2 lb

## 2014-01-23 DIAGNOSIS — Z860100 Personal history of colon polyps, unspecified: Secondary | ICD-10-CM

## 2014-01-23 MED ORDER — MOVIPREP 100 G PO SOLR: 1.0000 | Freq: Once | ORAL | Status: DC

## 2014-01-23 NOTE — Progress Notes (Signed)
No egg or soy allergy. No anesthesia problems.  

## 2014-01-24 ENCOUNTER — Encounter: Payer: Self-pay | Admitting: Gastroenterology

## 2014-02-05 ENCOUNTER — Telehealth: Payer: Self-pay | Admitting: Gastroenterology

## 2014-02-05 NOTE — Telephone Encounter (Signed)
No charge. 

## 2014-02-05 NOTE — Telephone Encounter (Signed)
Definitely no charge

## 2014-02-06 ENCOUNTER — Encounter: Payer: BC Managed Care – PPO | Admitting: Gastroenterology

## 2014-04-10 ENCOUNTER — Ambulatory Visit (AMBULATORY_SURGERY_CENTER): Payer: BC Managed Care – PPO | Admitting: Gastroenterology

## 2014-04-10 ENCOUNTER — Encounter: Payer: Self-pay | Admitting: Gastroenterology

## 2014-04-10 VITALS — BP 126/86 | HR 67 | Temp 98.5°F | Resp 16 | Ht 63.0 in | Wt 226.0 lb

## 2014-04-10 DIAGNOSIS — Z86010 Personal history of colonic polyps: Secondary | ICD-10-CM

## 2014-04-10 DIAGNOSIS — D126 Benign neoplasm of colon, unspecified: Secondary | ICD-10-CM

## 2014-04-10 MED ORDER — SODIUM CHLORIDE 0.9 % IV SOLN: 500.0000 mL | INTRAVENOUS | Status: DC

## 2014-04-10 NOTE — Patient Instructions (Signed)
YOU HAD AN ENDOSCOPIC PROCEDURE TODAY AT THE [facility]: Refer to the procedure report that was given to you for any specific questions about what was found during the examination.  If the procedure report does not answer your questions, please call your gastroenterologist to clarify.  If you requested that your care partner not be given the details of your procedure findings, then the procedure report has been included in a sealed envelope for you to review at your convenience later.  YOU SHOULD EXPECT: Some feelings of bloating in the abdomen. Passage of more gas than usual.  Walking can help get rid of the air that was put into your GI tract during the procedure and reduce the bloating. If you had a lower endoscopy (such as a colonoscopy or flexible sigmoidoscopy) you may notice spotting of blood in your stool or on the toilet paper. If you underwent a bowel prep for your procedure, then you may not have a normal bowel movement for a few days.  DIET: Your first meal following the procedure should be a light meal and then it is ok to progress to your normal diet.  A half-sandwich or bowl of soup is an example of a good first meal.  Heavy or fried foods are harder to digest and may make you feel nauseous or bloated.  Likewise meals heavy in dairy and vegetables can cause extra gas to form and this can also increase the bloating.  Drink plenty of fluids but you should avoid alcoholic beverages for 24 hours.  ACTIVITY: Your care partner should take you home directly after the procedure.  You should plan to take it easy, moving slowly for the rest of the day.  You can resume normal activity the day after the procedure however you should NOT DRIVE or use heavy machinery for 24 hours (because of the sedation medicines used during the test).    SYMPTOMS TO REPORT IMMEDIATELY: A gastroenterologist can be reached at any hour.  During normal business hours, 8:30 AM to 5:00 PM Monday through Friday,  call [phone].  After hours and on weekends, please call the GI answering service at [phone] who will take a message and have the physician on call contact you.   Following lower endoscopy (colonoscopy or flexible sigmoidoscopy):  Excessive amounts of blood in the stool  Significant tenderness or worsening of abdominal pains  Swelling of the abdomen that is new, acute  Fever of 100F or higher  FOLLOW UP: If any biopsies were taken you will be contacted by phone or by letter within the next 1-3 weeks.  Call your gastroenterologist if you have not heard about the biopsies in 3 weeks.  Our staff will call the home number listed on your records the next business day following your procedure to check on you and address any questions or concerns that you may have at that time regarding the information given to you following your procedure. This is a courtesy call and so if there is no answer at the home number and we have not heard from you through the emergency physician on call, we will assume that you have returned to your regular daily activities without incident.  SIGNATURES/CONFIDENTIALITY: You and/or your care partner have signed paperwork which will be entered into your electronic medical record.  These signatures attest to the fact that that the information above on your After Visit Summary has been reviewed and is understood.  Full responsibility of the confidentiality of this   discharge information lies with you and/or your care-partner.  Recommendations Next colonoscopy in 5 years due to personal history of pre-cancerous polyps.

## 2014-04-10 NOTE — Op Note (Signed)
[facility]  [address] [place], [address]   COLONOSCOPY PROCEDURE REPORT  PATIENT: Brianna Mcintyre, Brianna Mcintyre  MR#: [id number] BIRTHDATE: 1956/08/21 , 41  yrs. old GENDER: Female ENDOSCOPIST: Milus Banister, MD PROCEDURE DATE:  04/10/2014 PROCEDURE:   Colonoscopy with snare polypectomy First Screening Colonoscopy - Avg.  risk and is 50 yrs.  old or older - No.  Prior Negative Screening - Now for repeat screening. N/A  History of Adenoma - Now for follow-up colonoscopy & has been > or = to 3 yrs.  Yes hx of adenoma.  Has been 3 or more years since last colonoscopy.  Polyps Removed Today? Yes. ASA CLASS:   Class II INDICATIONS:two polyps removed 2010 by Dr.  Lajoyce Corners; shape/size not documented; both were adenomatous, the polyp in rectum had focal HGD, she was told to have repeat colonoscopy at 1 year interval. Repeat colonoscopy Dr.  Ardis Hughs March 2011 found several diminutive hyperplastic polyps.  Recommended repeat colonoscopy at 3 year interval.. MEDICATIONS: MAC sedation, administered by CRNA and propofol (Diprivan) 350mg  IV  DESCRIPTION OF PROCEDURE:   After the risks benefits and alternatives of the procedure were thoroughly explained, informed consent was obtained.  A digital rectal exam revealed no abnormalities of the rectum.   The LB [id number]  endoscope was introduced through the anus and advanced to the cecum, which was identified by both the appendix and ileocecal valve. No adverse events experienced.   The quality of the prep was excellent.  The instrument was then slowly withdrawn as the colon was fully examined.   COLON FINDINGS: One polyp was found, removed and sent to pathology. This was sessile, 20mm across, located in descending segment, removed with cold snare and sent to pathology.  The examination was otherwise normal.  Retroflexed views revealed no abnormalities. The time to cecum=4 minutes 45 seconds.  Withdrawal time=8 minutes 49 seconds.   The scope was withdrawn and the procedure completed. COMPLICATIONS: There were no complications.  ENDOSCOPIC IMPRESSION: One polyp was found, removed and sent to pathology. The examination was otherwise normal.  RECOMMENDATIONS: Given your personal history of adenomatous (pre-cancerous) polyps, you will need a repeat colonoscopy in 5 years even if the polyp removed today is NOT precancerous.   eSigned:  Milus Banister, MD 04/10/2014 9:41 AM

## 2014-04-10 NOTE — Progress Notes (Signed)
Report to pacu rn, vss, bbs=clear 

## 2014-04-10 NOTE — Progress Notes (Signed)
Called to room to assist during endoscopic procedure.  Patient ID and intended procedure confirmed with present staff. Received instructions for my participation in the procedure from the performing physician.  

## 2014-04-11 ENCOUNTER — Telehealth: Payer: Self-pay

## 2014-04-11 NOTE — Telephone Encounter (Signed)
  Follow up Call-  Call back number 04/10/2014 06/08/2012  Post procedure Call Back phone  # [phone] cell [phone]  Permission to leave phone message Yes Yes     Patient questions:  Do you have a fever, pain , or abdominal swelling? no Pain Score  0 *  Have you tolerated food without any problems? yes  Have you been able to return to your normal activities? yes  Do you have any questions about your discharge instructions: Diet   no Medications  no Follow up visit  no  Do you have questions or concerns about your Care? no  Actions: * If pain score is 4 or above: No action needed, pain <4.

## 2014-04-17 ENCOUNTER — Encounter: Payer: Self-pay | Admitting: Gastroenterology

## 2014-08-15 ENCOUNTER — Encounter: Payer: Self-pay | Admitting: Gastroenterology

## 2014-10-08 ENCOUNTER — Other Ambulatory Visit: Payer: Self-pay

## 2014-10-09 LAB — CYTOLOGY - PAP

## 2015-07-22 ENCOUNTER — Other Ambulatory Visit: Payer: Self-pay

## 2015-07-22 DIAGNOSIS — Z1231 Encounter for screening mammogram for malignant neoplasm of breast: Secondary | ICD-10-CM

## 2015-08-29 ENCOUNTER — Ambulatory Visit
Admission: RE | Admit: 2015-08-29 | Discharge: 2015-08-29 | Disposition: A | Discharge status: Home / Self Care | Payer: BLUE CROSS/BLUE SHIELD | Source: Ambulatory Visit

## 2015-08-29 DIAGNOSIS — Z1231 Encounter for screening mammogram for malignant neoplasm of breast: Secondary | ICD-10-CM

## 2015-11-12 ENCOUNTER — Telehealth: Payer: Self-pay | Admitting: Gastroenterology

## 2015-11-12 NOTE — Telephone Encounter (Signed)
pt states that she is having Lower L side pain with a burning sensation. she states that she went to ob-gyn and they told her that something could be going on with her intestines.

## 2015-11-12 NOTE — Telephone Encounter (Signed)
Pt has intermittent Left side abd pain, saw GYN and was told to see GI.  Appt given for 11/25/15 with Cecille Rubin.  Pt will call PCP and if she can be seen sooner she will call to cancel.

## 2015-11-25 ENCOUNTER — Ambulatory Visit (INDEPENDENT_AMBULATORY_CARE_PROVIDER_SITE_OTHER): Payer: BLUE CROSS/BLUE SHIELD | Admitting: Physician Assistant

## 2015-11-25 ENCOUNTER — Encounter: Payer: Self-pay | Admitting: Physician Assistant

## 2015-11-25 VITALS — BP 126/80 | HR 72 | Ht 63.0 in | Wt 202.2 lb

## 2015-11-25 DIAGNOSIS — K589 Irritable bowel syndrome without diarrhea: Secondary | ICD-10-CM | POA: Diagnosis not present

## 2015-11-25 DIAGNOSIS — R1032 Left lower quadrant pain: Secondary | ICD-10-CM | POA: Diagnosis not present

## 2015-11-25 NOTE — Patient Instructions (Signed)
Please start a high fiber, low fat diet. We have given you some information to read and review.  Continue your Hyoscyamine under the tongue every 6 hours as needed. May repeat dose in 30 min if 1st dose doesn't work.  Please call the office to schedule a 1 month follow up. [phone]

## 2015-11-25 NOTE — Progress Notes (Signed)
Patient ID: Brianna Mcintyre, female   DOB: [birth year], 59 y.o.   MRN: [id number]     History of Present Illness: Brianna Mcintyre is a pleasant 59 year old female who is known to Dr. Ardis Hughs with a history of colon polyps. Her last colonoscopy was on 04/10/2014 at which time one polyp was removed in the descending colon. It was found to be tubular adenoma, negative for high-grade dysplasia or malignancy. She was advised to have surveillance in 5 years.  She presents today with a 2 to three-month history of left lower quadrant abdominal pain. She states her pain is intermittent and comes and goes. Initially she thought it was due to a urinary tract infection and so she would drink a lot of cranberry juice, but says it didn't help. She states that on the Saturday before Thanksgiving and she ate lunch at and near a bread and shortly thereafter developed severe left lower quadrant abdominal pain that sounds like a small wheezing and burning sensation. She went home and lay down and eventually after having a bowel movement the pain subsided. The next day after eating breakfast, the same thing happened. She scheduled an appointment with her gynecologist and had a pelvic ultrasound that was negative. She was advised to follow-up with GI. She reports that her episodes usually occur after meals and are usually alleviated if she can pass gas. She was evaluated by her primary care provider on November 23 and sent for blood work which was normal. She was empirically placed on cephalexin 500 mg 4 times daily. She had diarrhea twice while on the cephalexin but her burning went away. She still has 2 more days of cephalexin. Her bowel movements are usually regular but recently she noticed in the morning after she eats breakfast and has a couple coffee she has to have an urgent bowel movement. She has had no bright red blood per rectum or melena. The pain does not radiate. She's had no associated fever, chills, or night sweats. The  pain is not associated with nausea or vomiting.   Past Medical History  Diagnosis Date  . History of colon polyps   . Thyroid cancer ([place])   . Allergy     seasonal  . GERD (gastroesophageal reflux disease)     Past Surgical History  Procedure Laterality Date  . Thyroid surgery  1993  . Colonoscopy    . Upper gi endoscopy     Family History  Problem Relation Age of Onset  . Thyroid cancer Mother   . Heart disease Mother   . Colon cancer Neg Hx   . Esophageal cancer Neg Hx   . Pancreatic cancer Neg Hx   . Rectal cancer Neg Hx   . Stomach cancer Neg Hx    Social History  Substance Use Topics  . Smoking status: Never Smoker   . Smokeless tobacco: Never Used  . Alcohol Use: Yes     Comment: rarely   Current Outpatient Prescriptions  Medication Sig Dispense Refill  . cephALEXin (KEFLEX) 500 MG capsule Take 500 mg by mouth 4 (four) times daily.     Marland Kitchen levothyroxine (SYNTHROID, LEVOTHROID) 125 MCG tablet Take 100 mcg by mouth daily before breakfast.     . RABEprazole (ACIPHEX) 20 MG tablet Take 20 mg by mouth daily.    . hyoscyamine (LEVSIN SL) 0.125 MG SL tablet Take 0.125 mg by mouth daily.     No current facility-administered medications for this visit.   Allergies  Allergen  Reactions  . Clarithromycin Nausea Only  . Codeine     REACTION: nausea     Review of Systems: Gen: Denies any fever, chills, sweats, anorexia, fatigue, weakness, malaise, weight loss, and sleep disorder CV: Denies chest pain, angina, palpitations, syncope, orthopnea, PND, peripheral edema, and claudication. Resp: Denies dyspnea at rest, dyspnea with exercise, cough, sputum, wheezing, coughing up blood, and pleurisy. GI: Denies vomiting blood, jaundice, and fecal incontinence.   Denies dysphagia or odynophagia. GU : Denies urinary burning, blood in urine, urinary frequency, urinary hesitancy, nocturnal urination, and urinary incontinence. MS: Denies joint pain, limitation of movement, and  swelling, stiffness, low back pain, extremity pain. Denies muscle weakness, cramps, atrophy.  Derm: Denies rash, itching, dry skin, hives, moles, warts, or unhealing ulcers.  Psych: Denies depression, anxiety, memory loss, suicidal ideation, hallucinations, paranoia, and confusion. Heme: Denies bruising, bleeding, and enlarged lymph nodes. Neuro:  Denies any headaches, dizziness, paresthesia Endo:  Denies any problems with DM, thyroid, adrenal  LAB RESULTS: Blood work on 11/13/2015 revealed a CBC with WBC 7.5, hemoglobin 15.2, hematocrit 46.2, platelets 304,000. Urinalysis 11/13/2015 was negative.  Physical Exam: BP 126/80 mmHg  Pulse 72  Ht 5\' 3"  (1.6 m)  Wt 202 lb 4 oz (91.74 kg)  BMI 35.84 kg/m2 General: Pleasant, well developed , Caucasian female in no acute distress Head: Normocephalic and atraumatic Eyes:  sclerae anicteric, conjunctiva pink  Ears: Normal auditory acuity Lungs: Clear throughout to auscultation Heart: Regular rate and rhythm Abdomen: Soft, non distended, non-tender. No masses, no hepatomegaly. Normal bowel sounds Musculoskeletal: Symmetrical with no gross deformities  Extremities: No edema  Neurological: Alert oriented x 4, grossly nonfocal Psychological:  Alert and cooperative. Normal mood and affect  Assessment and Recommendations: 59 year old female with a several month history of left lower quadrant pain, referred for evaluation. Patient's pain seems to occur most often postprandially, and is typically alleviated with passage of gas or having a bowel movement. This is likely due to to a functional disorder/IBS. She's been instructed to adhere to a high-fiber low-fat diet. She will use hyoscyamine sublingual 0.125 mg one every 6 hours when necessary. She will return in one month for reevaluation, sooner if needed. She was advised to call us in a week if the hyoscyamine is not working at which time she may be considered for a trial of Bentyl. If her pain does not  improve she may be a candidate for CT scan.       Kelvon Giannini, Vita Barley PA-C 11/25/2015,

## 2015-11-26 NOTE — Progress Notes (Signed)
i agree with the above note, plan 

## 2016-10-14 ENCOUNTER — Other Ambulatory Visit: Payer: Self-pay | Admitting: Obstetrics and Gynecology

## 2016-10-14 DIAGNOSIS — Z1231 Encounter for screening mammogram for malignant neoplasm of breast: Secondary | ICD-10-CM

## 2016-11-05 ENCOUNTER — Ambulatory Visit
Admission: RE | Admit: 2016-11-05 | Discharge: 2016-11-05 | Disposition: A | Discharge status: Home / Self Care | Payer: BLUE CROSS/BLUE SHIELD | Source: Ambulatory Visit | Attending: Obstetrics and Gynecology | Admitting: Obstetrics and Gynecology

## 2016-11-05 DIAGNOSIS — Z1231 Encounter for screening mammogram for malignant neoplasm of breast: Secondary | ICD-10-CM

## 2018-03-01 DIAGNOSIS — E7800 Pure hypercholesterolemia, unspecified: Secondary | ICD-10-CM | POA: Diagnosis not present

## 2018-03-01 DIAGNOSIS — E890 Postprocedural hypothyroidism: Secondary | ICD-10-CM | POA: Diagnosis not present

## 2018-03-01 DIAGNOSIS — Z0000 Encounter for general adult medical examination without abnormal findings: Secondary | ICD-10-CM | POA: Diagnosis not present

## 2018-03-01 DIAGNOSIS — C73 Malignant neoplasm of thyroid gland: Secondary | ICD-10-CM | POA: Diagnosis not present

## 2018-03-01 DIAGNOSIS — E559 Vitamin D deficiency, unspecified: Secondary | ICD-10-CM | POA: Diagnosis not present

## 2018-03-08 DIAGNOSIS — C73 Malignant neoplasm of thyroid gland: Secondary | ICD-10-CM | POA: Diagnosis not present

## 2018-03-08 DIAGNOSIS — E890 Postprocedural hypothyroidism: Secondary | ICD-10-CM | POA: Diagnosis not present

## 2018-03-10 DIAGNOSIS — Z23 Encounter for immunization: Secondary | ICD-10-CM | POA: Diagnosis not present

## 2018-03-10 DIAGNOSIS — R7989 Other specified abnormal findings of blood chemistry: Secondary | ICD-10-CM | POA: Diagnosis not present

## 2018-03-10 DIAGNOSIS — Z0000 Encounter for general adult medical examination without abnormal findings: Secondary | ICD-10-CM | POA: Diagnosis not present

## 2018-03-10 DIAGNOSIS — E559 Vitamin D deficiency, unspecified: Secondary | ICD-10-CM | POA: Diagnosis not present

## 2018-03-10 DIAGNOSIS — E7800 Pure hypercholesterolemia, unspecified: Secondary | ICD-10-CM | POA: Diagnosis not present

## 2018-07-11 DIAGNOSIS — R6884 Jaw pain: Secondary | ICD-10-CM | POA: Diagnosis not present

## 2018-07-11 DIAGNOSIS — M542 Cervicalgia: Secondary | ICD-10-CM | POA: Diagnosis not present

## 2018-07-11 DIAGNOSIS — R51 Headache: Secondary | ICD-10-CM | POA: Diagnosis not present

## 2018-07-12 DIAGNOSIS — E890 Postprocedural hypothyroidism: Secondary | ICD-10-CM | POA: Diagnosis not present

## 2018-07-18 ENCOUNTER — Other Ambulatory Visit: Payer: Self-pay | Admitting: Endocrinology

## 2018-07-18 DIAGNOSIS — M542 Cervicalgia: Secondary | ICD-10-CM | POA: Diagnosis not present

## 2018-07-18 DIAGNOSIS — R6884 Jaw pain: Secondary | ICD-10-CM | POA: Diagnosis not present

## 2018-07-18 DIAGNOSIS — R221 Localized swelling, mass and lump, neck: Secondary | ICD-10-CM | POA: Diagnosis not present

## 2018-07-18 DIAGNOSIS — C73 Malignant neoplasm of thyroid gland: Secondary | ICD-10-CM | POA: Diagnosis not present

## 2018-07-18 DIAGNOSIS — R51 Headache: Secondary | ICD-10-CM | POA: Diagnosis not present

## 2018-07-18 DIAGNOSIS — E890 Postprocedural hypothyroidism: Secondary | ICD-10-CM | POA: Diagnosis not present

## 2018-07-25 DIAGNOSIS — M542 Cervicalgia: Secondary | ICD-10-CM | POA: Diagnosis not present

## 2018-07-25 DIAGNOSIS — R51 Headache: Secondary | ICD-10-CM | POA: Diagnosis not present

## 2018-07-25 DIAGNOSIS — R6884 Jaw pain: Secondary | ICD-10-CM | POA: Diagnosis not present

## 2018-07-26 ENCOUNTER — Ambulatory Visit
Admission: RE | Admit: 2018-07-26 | Discharge: 2018-07-26 | Disposition: A | Discharge status: Home / Self Care | Payer: BLUE CROSS/BLUE SHIELD | Source: Ambulatory Visit | Attending: Endocrinology | Admitting: Endocrinology

## 2018-07-26 DIAGNOSIS — R221 Localized swelling, mass and lump, neck: Secondary | ICD-10-CM

## 2018-08-01 DIAGNOSIS — R6884 Jaw pain: Secondary | ICD-10-CM | POA: Diagnosis not present

## 2018-08-01 DIAGNOSIS — M542 Cervicalgia: Secondary | ICD-10-CM | POA: Diagnosis not present

## 2018-08-01 DIAGNOSIS — R51 Headache: Secondary | ICD-10-CM | POA: Diagnosis not present

## 2018-08-08 DIAGNOSIS — R6884 Jaw pain: Secondary | ICD-10-CM | POA: Diagnosis not present

## 2018-08-08 DIAGNOSIS — M542 Cervicalgia: Secondary | ICD-10-CM | POA: Diagnosis not present

## 2018-08-08 DIAGNOSIS — R51 Headache: Secondary | ICD-10-CM | POA: Diagnosis not present

## 2018-08-15 DIAGNOSIS — M542 Cervicalgia: Secondary | ICD-10-CM | POA: Diagnosis not present

## 2018-08-15 DIAGNOSIS — R6884 Jaw pain: Secondary | ICD-10-CM | POA: Diagnosis not present

## 2018-08-15 DIAGNOSIS — R51 Headache: Secondary | ICD-10-CM | POA: Diagnosis not present

## 2018-09-29 ENCOUNTER — Other Ambulatory Visit: Payer: Self-pay | Admitting: Obstetrics and Gynecology

## 2018-09-29 DIAGNOSIS — Z1231 Encounter for screening mammogram for malignant neoplasm of breast: Secondary | ICD-10-CM

## 2018-10-12 DIAGNOSIS — R6884 Jaw pain: Secondary | ICD-10-CM | POA: Diagnosis not present

## 2018-10-12 DIAGNOSIS — R51 Headache: Secondary | ICD-10-CM | POA: Diagnosis not present

## 2018-10-12 DIAGNOSIS — M542 Cervicalgia: Secondary | ICD-10-CM | POA: Diagnosis not present

## 2018-10-19 ENCOUNTER — Ambulatory Visit
Admission: RE | Admit: 2018-10-19 | Discharge: 2018-10-19 | Disposition: A | Discharge status: Home / Self Care | Payer: BLUE CROSS/BLUE SHIELD | Source: Ambulatory Visit | Attending: Obstetrics and Gynecology | Admitting: Obstetrics and Gynecology

## 2018-10-19 DIAGNOSIS — Z1231 Encounter for screening mammogram for malignant neoplasm of breast: Secondary | ICD-10-CM

## 2018-10-26 DIAGNOSIS — M545 Low back pain: Secondary | ICD-10-CM | POA: Diagnosis not present

## 2018-10-26 DIAGNOSIS — M542 Cervicalgia: Secondary | ICD-10-CM | POA: Diagnosis not present

## 2018-10-26 DIAGNOSIS — R51 Headache: Secondary | ICD-10-CM | POA: Diagnosis not present

## 2018-10-26 DIAGNOSIS — R6884 Jaw pain: Secondary | ICD-10-CM | POA: Diagnosis not present

## 2018-11-14 DIAGNOSIS — Z01419 Encounter for gynecological examination (general) (routine) without abnormal findings: Secondary | ICD-10-CM | POA: Diagnosis not present

## 2018-11-14 DIAGNOSIS — Z6841 Body Mass Index (BMI) 40.0 and over, adult: Secondary | ICD-10-CM | POA: Diagnosis not present

## 2018-12-27 DIAGNOSIS — R6884 Jaw pain: Secondary | ICD-10-CM | POA: Diagnosis not present

## 2018-12-27 DIAGNOSIS — M545 Low back pain: Secondary | ICD-10-CM | POA: Diagnosis not present

## 2018-12-27 DIAGNOSIS — M542 Cervicalgia: Secondary | ICD-10-CM | POA: Diagnosis not present

## 2018-12-27 DIAGNOSIS — R51 Headache: Secondary | ICD-10-CM | POA: Diagnosis not present

## 2018-12-30 DIAGNOSIS — R51 Headache: Secondary | ICD-10-CM | POA: Diagnosis not present

## 2018-12-30 DIAGNOSIS — R6884 Jaw pain: Secondary | ICD-10-CM | POA: Diagnosis not present

## 2018-12-30 DIAGNOSIS — M542 Cervicalgia: Secondary | ICD-10-CM | POA: Diagnosis not present

## 2018-12-30 DIAGNOSIS — M545 Low back pain: Secondary | ICD-10-CM | POA: Diagnosis not present

## 2019-01-05 DIAGNOSIS — M542 Cervicalgia: Secondary | ICD-10-CM | POA: Diagnosis not present

## 2019-01-05 DIAGNOSIS — R51 Headache: Secondary | ICD-10-CM | POA: Diagnosis not present

## 2019-01-05 DIAGNOSIS — R6884 Jaw pain: Secondary | ICD-10-CM | POA: Diagnosis not present

## 2019-01-05 DIAGNOSIS — M545 Low back pain: Secondary | ICD-10-CM | POA: Diagnosis not present

## 2019-01-11 DIAGNOSIS — R6884 Jaw pain: Secondary | ICD-10-CM | POA: Diagnosis not present

## 2019-01-11 DIAGNOSIS — R51 Headache: Secondary | ICD-10-CM | POA: Diagnosis not present

## 2019-01-11 DIAGNOSIS — M542 Cervicalgia: Secondary | ICD-10-CM | POA: Diagnosis not present

## 2019-01-11 DIAGNOSIS — M545 Low back pain: Secondary | ICD-10-CM | POA: Diagnosis not present

## 2019-04-28 ENCOUNTER — Encounter: Payer: Self-pay | Admitting: Gastroenterology

## 2019-06-06 ENCOUNTER — Ambulatory Visit: Payer: BLUE CROSS/BLUE SHIELD | Admitting: Gastroenterology

## 2019-06-07 DIAGNOSIS — E890 Postprocedural hypothyroidism: Secondary | ICD-10-CM | POA: Diagnosis not present

## 2019-06-07 DIAGNOSIS — E7800 Pure hypercholesterolemia, unspecified: Secondary | ICD-10-CM | POA: Diagnosis not present

## 2019-06-07 DIAGNOSIS — C73 Malignant neoplasm of thyroid gland: Secondary | ICD-10-CM | POA: Diagnosis not present

## 2019-06-14 DIAGNOSIS — E890 Postprocedural hypothyroidism: Secondary | ICD-10-CM | POA: Diagnosis not present

## 2019-06-14 DIAGNOSIS — Z0000 Encounter for general adult medical examination without abnormal findings: Secondary | ICD-10-CM | POA: Diagnosis not present

## 2019-06-19 ENCOUNTER — Encounter: Payer: Self-pay | Admitting: Gastroenterology

## 2019-06-19 ENCOUNTER — Ambulatory Visit (INDEPENDENT_AMBULATORY_CARE_PROVIDER_SITE_OTHER): Payer: BC Managed Care – PPO | Admitting: Gastroenterology

## 2019-06-19 VITALS — Ht 63.0 in | Wt 202.0 lb

## 2019-06-19 DIAGNOSIS — K219 Gastro-esophageal reflux disease without esophagitis: Secondary | ICD-10-CM | POA: Diagnosis not present

## 2019-06-19 DIAGNOSIS — R1310 Dysphagia, unspecified: Secondary | ICD-10-CM | POA: Diagnosis not present

## 2019-06-19 DIAGNOSIS — Z86010 Personal history of colonic polyps: Secondary | ICD-10-CM

## 2019-06-19 MED ORDER — PEG 3350-KCL-NA BICARB-NACL 420 G PO SOLR: 4000.0000 mL | ORAL | 0 refills | Status: DC

## 2019-06-19 NOTE — Progress Notes (Signed)
Review of pertinent gastrointestinal problems: 1. Adenomatous polyp (previous + HGD) removed 2010 by Dr. Lajoyce Corners; shape/size not documented; both were adenomatous, the polyp in rectum had focal HGD, she was told to have repeat colonoscopy at 1 year interval.  Repeat colonoscopy Dr. Ardis Hughs March 2011 found several diminutive hyperplastic polyps.  Colonoscopy April 2015 Dr. Ardis Hughs found subcentimeter tubular adenoma.  She was recommended a repeat colonoscopy at 5-year interval (given HRA in past) 2. Fluttering sensation in esophagus. BEsophagram in 2013 suggested a smooth distal esopahgus stricture.  Follow up EGD 05/2012 found slight narrowing at the GE junction, smooth, clearly benign.  Dilated with 20 mm balloon.  Recommended that if her symptoms persisted that I would likely recommend proceeding with manometry testing.   This service was provided via virtual visit.  We used audio and visual for about 5 minutes and then that communication was too pixelated and digitized and so we went with just audio only.  The patient was located at home.  I was located in my office.  The patient did consent to this virtual visit and is aware of possible charges through their insurance for this visit.  She was last in our office about 3-1/2 years ago, she is considered a "new patient" my certified medical assistant, Grace Bushy, contributed to this visit by contacting the patient by phone 1 or 2 business days prior to the appointment and also followed up on the recommendations I made after the visit.  Time spent on virtual visit: 27 minutes   HPI: This is a very pleasant 63 year old woman.  She was last here in our office about 3-1/2 years ago.  She knows that she is "due" for a colonoscopy for history of precancerous colon polyps.  She was wondering if she needs an upper endoscopy at the same time.  She has sensation of swallowing an air bubble.  Makes her need to spit a lot of phlegm.  Bile taste in her mouth.  Occurs  3-10 times per week.  Solids and liquids.  Has regurgitated the food in the past.   This occurred in her youth but it is occurring much more often lately.  She does not remember ever having the upper endoscopy which I performed for her about 7 years ago.  She cannot therefore comment whether the balloon dilation helped or not.   She gets heartburn.  Spicy foods make it worse takes TUMS only (dyspepsia on PPI).  If she doesn't then she has belching, burning.  She has not tried pepcid.  Mentioned 'quivering' again. This has been an issue even 7-8 years ago.  Can be temperature dependent.  Chief complaint is personal history of high risk adenoma, dysphasia, GERD  ROS: complete GI ROS as described in HPI, all other review negative.  Constitutional:  No unintentional weight loss   Past Medical History:  Diagnosis Date  . Allergy    seasonal  . GERD (gastroesophageal reflux disease)   . History of colon polyps   . Thyroid cancer [facility])     Past Surgical History:  Procedure Laterality Date  . COLONOSCOPY    . THYROID SURGERY  1993  . UPPER GI ENDOSCOPY      Current Outpatient Medications  Medication Sig Dispense Refill  . levothyroxine (SYNTHROID, LEVOTHROID) 125 MCG tablet Take 100 mcg by mouth daily before breakfast.      No current facility-administered medications for this visit.     Allergies as of 06/19/2019 - Review Complete 06/19/2019  Allergen  Reaction Noted  . Clarithromycin Nausea Only 02/02/2013  . Codeine  02/26/2010    Family History  Problem Relation Age of Onset  . Thyroid cancer Mother   . Heart disease Mother   . Colon cancer Neg Hx   . Esophageal cancer Neg Hx   . Pancreatic cancer Neg Hx   . Rectal cancer Neg Hx   . Stomach cancer Neg Hx     Social History   Socioeconomic History  . Marital status: Divorced    Spouse name: Not on file  . Number of children: 4  . Years of education: Not on file  . Highest education level: Not on file   Occupational History  . Occupation: DIRECTOR OF Scientist, clinical (histocompatibility and immunogenetics): [employer]  . Financial resource strain: Not on file  . Food insecurity    Worry: Not on file    Inability: Not on file  . Transportation needs    Medical: Not on file    Non-medical: Not on file  Tobacco Use  . Smoking status: Never Smoker  . Smokeless tobacco: Never Used  Substance and Sexual Activity  . Alcohol use: Yes    Comment: rarely  . Drug use: No  . Sexual activity: Not on file  Lifestyle  . Physical activity    Days per week: Not on file    Minutes per session: Not on file  . Stress: Not on file  Relationships  . Social Herbalist on phone: Not on file    Gets together: Not on file    Attends religious service: Not on file    Active member of club or organization: Not on file    Attends meetings of clubs or organizations: Not on file    Relationship status: Not on file  . Intimate partner violence    Fear of current or ex partner: Not on file    Emotionally abused: Not on file    Physically abused: Not on file    Forced sexual activity: Not on file  Other Topics Concern  . Not on file  Social History Narrative  . Not on file     Physical Exam: Unable to perform because this was a "telemed visit" due to current Covid-19 pandemic  Assessment and plan: 63 y.o. female with personal history of high risk adenoma, dysphasia, GERD  I recommended a colonoscopy at her soonest convenience.  She had a polyp with high-grade dysplasia many years ago because of that she needs a colonoscopy about every 5 years.  I recommended at the same time that we proceed with an EGD.  She has dysphasia and GERD.  She has had this previously.  She does not recall the EGD that I performed for her about 7 years ago and so she cannot comment on whether the dilation helped.  Her symptoms might just be acid related and so instead of Tums she is going to start taking a Pepcid twice daily.  It sounds  like she has had dyspepsia from proton pump inhibitors in the past.  If she is still bothered by dysphasia and there are no significant findings on her EGD to explain why, I will probably recommend esophageal manometry testing.  Please see the "Patient Instructions" section for addition details about the plan.  Owens Loffler, MD [facility] 06/19/2019, 2:52 PM

## 2019-06-19 NOTE — Patient Instructions (Addendum)
She is going to stop taking Tums and instead she will take Pepcid 20 mg pills.  She will take 1 pill at breakfast and 1 pill at bedtime every night  We will arrange for colonoscopy for personal history of polyps.  Same time upper endoscopy for dysphasia and GERD.  Thank you for entrusting me with your care and choosing [facility].  Dr Ardis Hughs

## 2019-06-21 ENCOUNTER — Telehealth: Payer: Self-pay | Admitting: Gastroenterology

## 2019-06-21 NOTE — Telephone Encounter (Signed)
Patient is scheduled for a procedure 8-5 and has to start ampicillin 500g 4x a day per her dentist and would like to know if it will be okay.

## 2019-06-21 NOTE — Telephone Encounter (Signed)
Spoke to patient. She will be done with ampicillin by 06/25/19. Informed her that she may proceed with EGD/Colonoscopy for next month. Patient voiced understanding.

## 2019-06-26 DIAGNOSIS — E559 Vitamin D deficiency, unspecified: Secondary | ICD-10-CM | POA: Diagnosis not present

## 2019-06-26 DIAGNOSIS — Z0001 Encounter for general adult medical examination with abnormal findings: Secondary | ICD-10-CM | POA: Diagnosis not present

## 2019-06-26 DIAGNOSIS — E890 Postprocedural hypothyroidism: Secondary | ICD-10-CM | POA: Diagnosis not present

## 2019-06-26 DIAGNOSIS — C73 Malignant neoplasm of thyroid gland: Secondary | ICD-10-CM | POA: Diagnosis not present

## 2019-07-25 ENCOUNTER — Telehealth: Payer: Self-pay | Admitting: Gastroenterology

## 2019-07-25 NOTE — Telephone Encounter (Signed)

## 2019-07-26 ENCOUNTER — Other Ambulatory Visit: Payer: Self-pay

## 2019-07-26 ENCOUNTER — Encounter: Payer: Self-pay | Admitting: Gastroenterology

## 2019-07-26 ENCOUNTER — Ambulatory Visit (AMBULATORY_SURGERY_CENTER): Payer: BC Managed Care – PPO | Admitting: Gastroenterology

## 2019-07-26 VITALS — BP 108/58 | HR 77 | Temp 98.3°F | Resp 18 | Ht 63.0 in | Wt 202.0 lb

## 2019-07-26 DIAGNOSIS — Z1211 Encounter for screening for malignant neoplasm of colon: Secondary | ICD-10-CM | POA: Diagnosis not present

## 2019-07-26 DIAGNOSIS — R1013 Epigastric pain: Secondary | ICD-10-CM | POA: Diagnosis not present

## 2019-07-26 DIAGNOSIS — Z86010 Personal history of colonic polyps: Secondary | ICD-10-CM | POA: Diagnosis not present

## 2019-07-26 DIAGNOSIS — D122 Benign neoplasm of ascending colon: Secondary | ICD-10-CM

## 2019-07-26 DIAGNOSIS — K219 Gastro-esophageal reflux disease without esophagitis: Secondary | ICD-10-CM

## 2019-07-26 MED ORDER — SODIUM CHLORIDE 0.9 % IV SOLN: 500.0000 mL | Freq: Once | INTRAVENOUS | Status: DC

## 2019-07-26 NOTE — Progress Notes (Signed)
Assist patient to bathroom. Ambulated around nursing station times 4. Constant belching and burping. Unable to pass gas. levsin times 2. Dr. Ardis Hughs notified ok to discharge home. Patient to take pepcid to aide in expelling of air. Also instructed to ambulate while home and if symptoms worsen.

## 2019-07-26 NOTE — Progress Notes (Signed)
Called to room to assist during endoscopic procedure.  Patient ID and intended procedure confirmed with present staff. Received instructions for my participation in the procedure from the performing physician.  

## 2019-07-26 NOTE — Op Note (Signed)
[facility] Patient Name: Brianna Mcintyre Procedure Date: 07/26/2019 9:39 AM MRN: [id number] Endoscopist: Milus Banister , MD Age: 63 Referring MD:  Date of Birth: 14-Sep-1956 Gender: Female Account #: [id number] Procedure:                Colonoscopy Indications:              High risk colon cancer surveillance: Personal                            history of colonic polyps; Adenomatous polyp                            (previous + HGD) removed 2010 by Dr. Lajoyce Corners;                            shape/size not documented; both were adenomatous,                            the polyp in rectum had focal HGD, she was told to                            have repeat colonoscopy at 1 year interval. Repeat                            colonoscopy Dr. Ardis Hughs March 2011 found several                            diminutive hyperplastic polyps. Colonoscopy April                            2015 Dr. Ardis Hughs found subcentimeter tubular                            adenoma. She was recommended a repeat colonoscopy                            at 5-year interval (given HRA in past) Medicines:                Monitored Anesthesia Care Procedure:                Pre-Anesthesia Assessment:                           - Prior to the procedure, a History and Physical                            was performed, and patient medications and                            allergies were reviewed. The patient's tolerance of                            previous anesthesia was also reviewed. The risks  and benefits of the procedure and the sedation                            options and risks were discussed with the patient.                            All questions were answered, and informed consent                            was obtained. Prior Anticoagulants: The patient has                            taken no previous anticoagulant or antiplatelet                            agents. ASA Grade Assessment: II  - A patient with                            mild systemic disease. After reviewing the risks                            and benefits, the patient was deemed in                            satisfactory condition to undergo the procedure.                           After obtaining informed consent, the colonoscope                            was passed under direct vision. Throughout the                            procedure, the patient's blood pressure, pulse, and                            oxygen saturations were monitored continuously. The                            Colonoscope was introduced through the anus and                            advanced to the the cecum, identified by                            appendiceal orifice and ileocecal valve. The                            colonoscopy was performed without difficulty. The                            patient tolerated the procedure well. The quality  of the bowel preparation was good. The ileocecal                            valve, appendiceal orifice, and rectum were                            photographed. Scope In: 9:49:11 AM Scope Out: 10:05:03 AM Scope Withdrawal Time: 0 hours 12 minutes 39 seconds  Total Procedure Duration: 0 hours 15 minutes 52 seconds  Findings:                 A 1 mm polyp was found in the ascending colon. The                            polyp was sessile. The polyp was removed with a                            cold biopsy forceps. Resection and retrieval were                            complete.                           The exam was otherwise without abnormality on                            direct and retroflexion views. Complications:            No immediate complications. Estimated blood loss:                            None. Estimated Blood Loss:     Estimated blood loss: none. Impression:               - One 1 mm polyp in the ascending colon, removed                             with a cold biopsy forceps. Resected and retrieved.                           - The examination was otherwise normal on direct                            and retroflexion views. Recommendation:           - Patient has a contact number available for                            emergencies. The signs and symptoms of potential                            delayed complications were discussed with the                            patient. Return to normal activities tomorrow.  Written discharge instructions were provided to the                            patient.                           - Resume previous diet.                           - Continue present medications.                           - Repeat colonoscopy is recommended. The                            colonoscopy date will be determined after pathology                            results from today's exam become available for                            review. Likely in 5 years given previous HRA Milus Banister, MD 07/26/2019 10:07:32 AM This report has been signed electronically.

## 2019-07-26 NOTE — Op Note (Signed)
[facility] Patient Name: Brianna Mcintyre Procedure Date: 07/26/2019 9:40 AM MRN: [id number] Endoscopist: Milus Banister , MD Age: 63 Referring MD:  Date of Birth: 05-28-1956 Gender: Female Account #: [id number] Procedure:                Upper GI endoscopy Indications:              Dyspepsia Medicines:                Monitored Anesthesia Care Procedure:                Pre-Anesthesia Assessment:                           - Prior to the procedure, a History and Physical                            was performed, and patient medications and                            allergies were reviewed. The patient's tolerance of                            previous anesthesia was also reviewed. The risks                            and benefits of the procedure and the sedation                            options and risks were discussed with the patient.                            All questions were answered, and informed consent                            was obtained. Prior Anticoagulants: The patient has                            taken no previous anticoagulant or antiplatelet                            agents. ASA Grade Assessment: II - A patient with                            mild systemic disease. After reviewing the risks                            and benefits, the patient was deemed in                            satisfactory condition to undergo the procedure.                           - Prior to the procedure, a History and Physical  was performed, and patient medications and                            allergies were reviewed. The patient's tolerance of                            previous anesthesia was also reviewed. The risks                            and benefits of the procedure and the sedation                            options and risks were discussed with the patient.                            All questions were answered, and informed consent                         was obtained. Prior Anticoagulants: The patient has                            taken no previous anticoagulant or antiplatelet                            agents. ASA Grade Assessment: II - A patient with                            mild systemic disease. After reviewing the risks                            and benefits, the patient was deemed in                            satisfactory condition to undergo the procedure.                           After obtaining informed consent, the endoscope was                            passed under direct vision. Throughout the                            procedure, the patient's blood pressure, pulse, and                            oxygen saturations were monitored continuously. The                            Endoscope was introduced through the mouth, and                            advanced to the second part of duodenum. The upper  GI endoscopy was accomplished without difficulty.                            The patient tolerated the procedure well. Scope In: Scope Out: Findings:                 The esophagus was normal.                           The stomach was normal.                           The examined duodenum was normal. Complications:            No immediate complications. Estimated blood loss:                            None. Estimated Blood Loss:     Estimated blood loss: none. Impression:               - Normal UGI tract. Recommendation:           - Patient has a contact number available for                            emergencies. The signs and symptoms of potential                            delayed complications were discussed with the                            patient. Return to normal activities tomorrow.                            Written discharge instructions were provided to the                            patient.                           - Resume previous diet.                            - Continue present medications; twice daily H2                            blocker (pepcid/famotidine 20mg  pills) Milus Banister, MD 07/26/2019 10:14:50 AM This report has been signed electronically.

## 2019-07-26 NOTE — Patient Instructions (Signed)
Discharge instructions given. Handout on polyps. Resume previous medications. YOU HAD AN ENDOSCOPIC PROCEDURE TODAY AT [facility]:   Refer to the procedure report that was given to you for any specific questions about what was found during the examination.  If the procedure report does not answer your questions, please call your gastroenterologist to clarify.  If you requested that your care partner not be given the details of your procedure findings, then the procedure report has been included in a sealed envelope for you to review at your convenience later.  YOU SHOULD EXPECT: Some feelings of bloating in the abdomen. Passage of more gas than usual.  Walking can help get rid of the air that was put into your GI tract during the procedure and reduce the bloating. If you had a lower endoscopy (such as a colonoscopy or flexible sigmoidoscopy) you may notice spotting of blood in your stool or on the toilet paper. If you underwent a bowel prep for your procedure, you may not have a normal bowel movement for a few days.  Please Note:  You might notice some irritation and congestion in your nose or some drainage.  This is from the oxygen used during your procedure.  There is no need for concern and it should clear up in a day or so.  SYMPTOMS TO REPORT IMMEDIATELY:   Following lower endoscopy (colonoscopy or flexible sigmoidoscopy):  Excessive amounts of blood in the stool  Significant tenderness or worsening of abdominal pains  Swelling of the abdomen that is new, acute  Fever of 100F or higher   Following upper endoscopy (EGD)  Vomiting of blood or coffee ground material  New chest pain or pain under the shoulder blades  Painful or persistently difficult swallowing  New shortness of breath  Fever of 100F or higher  Black, tarry-looking stools  For urgent or emergent issues, a gastroenterologist can be reached at any hour by calling [phone].   DIET:  We do  recommend a small meal at first, but then you may proceed to your regular diet.  Drink plenty of fluids but you should avoid alcoholic beverages for 24 hours.  ACTIVITY:  You should plan to take it easy for the rest of today and you should NOT DRIVE or use heavy machinery until tomorrow (because of the sedation medicines used during the test).    FOLLOW UP: Our staff will call the number listed on your records 48-72 hours following your procedure to check on you and address any questions or concerns that you may have regarding the information given to you following your procedure. If we do not reach you, we will leave a message.  We will attempt to reach you two times.  During this call, we will ask if you have developed any symptoms of COVID 19. If you develop any symptoms (ie: fever, flu-like symptoms, shortness of breath, cough etc.) before then, please call [phone].  If you test positive for Covid 19 in the 2 weeks post procedure, please call and report this information to [place].    If any biopsies were taken you will be contacted by phone or by letter within the next 1-3 weeks.  Please call us at [phone] if you have not heard about the biopsies in 3 weeks.    SIGNATURES/CONFIDENTIALITY: You and/or your care partner have signed paperwork which will be entered into your electronic medical record.  These signatures attest to the fact that that the information above on  your After Visit Summary has been reviewed and is understood.  Full responsibility of the confidentiality of this discharge information lies with you and/or your care-partner. 

## 2019-07-26 NOTE — Progress Notes (Signed)
Temp taken by WR VS taken by CW 

## 2019-07-26 NOTE — Progress Notes (Signed)
PT taken to PACU. Monitors in place. VSS. Report given to RN. 

## 2019-07-28 ENCOUNTER — Telehealth: Payer: Self-pay

## 2019-07-28 NOTE — Telephone Encounter (Signed)
  Follow up Call-  Call back number 07/26/2019  Post procedure Call Back phone  # [phone]  Permission to leave phone message Yes  Some recent data might be hidden     Patient questions:  Do you have a fever, pain , or abdominal swelling? No. Pain Score  0 *  Have you tolerated food without any problems? Yes.    Have you been able to return to your normal activities? Yes.    Do you have any questions about your discharge instructions: Diet   No. Medications  No. Follow up visit  No.  Do you have questions or concerns about your Care? No.  Actions: * If pain score is 4 or above: No action needed, pain <4.  1. Have you developed a fever since your procedure? no  2.   Have you had an respiratory symptoms (SOB or cough) since your procedure? no  3.   Have you tested positive for COVID 19 since your procedure no  4.   Have you had any family members/close contacts diagnosed with the COVID 19 since your procedure?  no   If yes to any of these questions please route to Joylene John, RN and Alphonsa Gin, Therapist, sports.

## 2019-07-28 NOTE — Telephone Encounter (Signed)
NO ANSWER, MESSAGE LEFT FOR PATIENT. 

## 2019-08-06 ENCOUNTER — Encounter: Payer: Self-pay | Admitting: Gastroenterology

## 2019-09-01 DIAGNOSIS — E890 Postprocedural hypothyroidism: Secondary | ICD-10-CM | POA: Diagnosis not present

## 2019-10-09 DIAGNOSIS — Z23 Encounter for immunization: Secondary | ICD-10-CM | POA: Diagnosis not present

## 2019-10-09 DIAGNOSIS — T781XXA Other adverse food reactions, not elsewhere classified, initial encounter: Secondary | ICD-10-CM | POA: Diagnosis not present

## 2019-10-09 DIAGNOSIS — R197 Diarrhea, unspecified: Secondary | ICD-10-CM | POA: Diagnosis not present

## 2019-11-29 ENCOUNTER — Other Ambulatory Visit: Payer: Self-pay | Admitting: Obstetrics and Gynecology

## 2019-11-29 DIAGNOSIS — Z6841 Body Mass Index (BMI) 40.0 and over, adult: Secondary | ICD-10-CM | POA: Diagnosis not present

## 2019-11-29 DIAGNOSIS — Z1231 Encounter for screening mammogram for malignant neoplasm of breast: Secondary | ICD-10-CM | POA: Diagnosis not present

## 2019-11-29 DIAGNOSIS — Z01419 Encounter for gynecological examination (general) (routine) without abnormal findings: Secondary | ICD-10-CM | POA: Diagnosis not present

## 2019-12-02 ENCOUNTER — Ambulatory Visit: Payer: BC Managed Care – PPO

## 2020-02-02 ENCOUNTER — Ambulatory Visit: Payer: Self-pay | Attending: Internal Medicine

## 2020-02-02 DIAGNOSIS — Z23 Encounter for immunization: Secondary | ICD-10-CM | POA: Insufficient documentation

## 2020-02-02 NOTE — Progress Notes (Signed)
   Covid-19 Vaccination Clinic  Name:  Brianna Mcintyre    MRN: [id number] DOB: 1956-07-12  02/02/2020  [name] was observed post Covid-19 immunization for 15 minutes without incidence. She was provided with Vaccine Information Sheet and instruction to access the V-Safe system.   [name] was instructed to call 911 with any severe reactions post vaccine: Marland Kitchen Difficulty breathing  . Swelling of your face and throat  . A fast heartbeat  . A bad rash all over your body  . Dizziness and weakness    Immunizations Administered    Name Date Dose VIS Date Route   Pfizer COVID-19 Vaccine 02/02/2020  5:33 PM 0.3 mL 12/01/2019 Intramuscular   Manufacturer: [place]   Lot: [id number]   [place]: [id number]

## 2020-02-25 ENCOUNTER — Ambulatory Visit: Payer: Self-pay | Attending: Internal Medicine

## 2020-02-25 DIAGNOSIS — Z23 Encounter for immunization: Secondary | ICD-10-CM | POA: Insufficient documentation

## 2020-02-25 NOTE — Progress Notes (Signed)
   Covid-19 Vaccination Clinic  Name:  Brianna Mcintyre    MRN: [id number] DOB: 09-28-1956  02/25/2020  [name] was observed post Covid-19 immunization for 15 minutes without incident. She was provided with Vaccine Information Sheet and instruction to access the V-Safe system.   [name] was instructed to call 911 with any severe reactions post vaccine: Marland Kitchen Difficulty breathing  . Swelling of face and throat  . A fast heartbeat  . A bad rash all over body  . Dizziness and weakness   Immunizations Administered    Name Date Dose VIS Date Route   Pfizer COVID-19 Vaccine 02/25/2020  9:46 AM 0.3 mL 12/01/2019 Intramuscular   Manufacturer: [place]   Lot: [id number]   [place]: [id number]

## 2020-04-15 ENCOUNTER — Ambulatory Visit: Payer: Self-pay | Attending: Internal Medicine

## 2020-04-15 DIAGNOSIS — Z20822 Contact with and (suspected) exposure to covid-19: Secondary | ICD-10-CM | POA: Insufficient documentation

## 2020-04-16 LAB — NOVEL CORONAVIRUS, NAA: SARS-CoV-2, NAA: NOT DETECTED

## 2020-04-16 LAB — SARS-COV-2, NAA 2 DAY TAT

## 2020-12-11 ENCOUNTER — Telehealth: Payer: Self-pay

## 2020-12-11 NOTE — Telephone Encounter (Signed)
NOTES ON FILE FROM [facility] [phone], SENT REFERRAL TO SCHEDULING 

## 2021-01-09 NOTE — Progress Notes (Signed)
Cardiology Office Note:    Date:  01/17/2021   ID:  Brianna Mcintyre, DOB 11-Mar-1956, MRN [id number]  PCP:  Deland Pretty, MD  [facility] Cardiologist:  No primary care provider on file.  [facility] Electrophysiologist:  None   Referring MD: Deland Pretty, MD     History of Present Illness:    Brianna Mcintyre is a 65 y.o. female with a hx of thyroid cancer s/p thyroidectomy and GERD who was referred by Dr. Shelia Media for evaluation of palpitations.   The patient states that since having issues with her thyroid, she has been having "flutters" in her chest. Symptoms are not triggered by exercise and can occur at any time. Her symptoms stop with coughing. No associated chest pain, SOB, dizziness, nausea, vomiting. Only has one cup of coffee a day. Given the persistence of her symptoms, she was referred to Cardiology for further management.  Today, the patient states that she underwent thyroidectomy for thyroid cancer. Usually she is on a high dose of synthroid to suppress her TSH significantly, however, they have been slowly lowering her dose. Since the last dose change, she has felt more palpitations. Currently TSH 2.33. Her palpitation episodes come in waves waves. Has them for a couple of days and then they go away. Each episode lasts a couple of seconds and then abates. She cannot predict when it is going to come on and can occur at any time. If she drinks something super cold, it may trigger the symptoms. She does not drink alcohol. Eats very healthy. Currently doing a "fast" where she is eating no processed foods or meats. She denies any associated chest pain, lightheadedness, dizziness, syncope, shortness of breath, orthopnea or LE edema.   Labs notable for TSH 2.33, HgB 15.2 ECG with NSR, no ischemia, no block, no ectopy  Past Medical History:  Diagnosis Date  . Allergy    seasonal  . GERD (gastroesophageal reflux disease)   . History of colon polyps   . Thyroid cancer [facility])      Past Surgical History:  Procedure Laterality Date  . COLONOSCOPY    . THYROID SURGERY  1993  . UPPER GI ENDOSCOPY      Current Medications: Current Meds  Medication Sig  . albuterol (VENTOLIN HFA) 108 (90 Base) MCG/ACT inhaler as needed.  . Cholecalciferol (VITAMIN D3) 50 MCG (2000 UT) TABS Take 1 tablet by mouth daily.  Marland Kitchen levothyroxine (SYNTHROID, LEVOTHROID) 125 MCG tablet Take 100 mcg by mouth daily before breakfast.      Allergies:   Clarithromycin and Codeine   Social History   Socioeconomic History  . Marital status: Divorced    Spouse name: Not on file  . Number of children: 4  . Years of education: Not on file  . Highest education level: Not on file  Occupational History  . Occupation: DIRECTOR OF Scientist, clinical (histocompatibility and immunogenetics): [employer]  Tobacco Use  . Smoking status: Never Smoker  . Smokeless tobacco: Never Used  Vaping Use  . Vaping Use: Never used  Substance and Sexual Activity  . Alcohol use: Yes    Comment: rarely  . Drug use: No  . Sexual activity: Not on file  Other Topics Concern  . Not on file  Social History Narrative  . Not on file   Social Determinants of Health   Financial Resource Strain: Not on file  Food Insecurity: Not on file  Transportation Needs: Not on file  Physical Activity: Not on file  Stress: Not on file  Social Connections: Not on file     Family History: The patient's family history includes Heart disease in her mother; Thyroid cancer in her mother. There is no history of Colon cancer, Esophageal cancer, Pancreatic cancer, Rectal cancer, or Stomach cancer.  ROS:   Please see the history of present illness.    Review of Systems  Constitutional: Negative for chills and fever.  HENT: Negative for congestion.   Eyes: Negative for blurred vision.  Respiratory: Negative for shortness of breath.   Cardiovascular: Positive for palpitations. Negative for chest pain, orthopnea, claudication, leg swelling and PND.  Gastrointestinal:  Negative for nausea and vomiting.  Genitourinary: Negative for hematuria.  Musculoskeletal: Negative for falls.  Neurological: Negative for dizziness and loss of consciousness.  Endo/Heme/Allergies: Negative for polydipsia.  Psychiatric/Behavioral: Negative for substance abuse.    EKGs/Labs/Other Studies Reviewed:    The following studies were reviewed today: No cardiac studies  EKG:  EKG 12/10/20: NSR, no ischemia, no block, no ectopy HR 71  Recent Labs: No results found for requested labs within last 8760 hours.  Recent Lipid Panel No results found for: CHOL, TRIG, HDL, CHOLHDL, VLDL, LDLCALC, LDLDIRECT    Physical Exam:    VS:  BP 130/84   Pulse 89   Ht 5' 3.5" (1.613 m)   Wt 227 lb 9.6 oz (103.2 kg)   SpO2 98%   BMI 39.69 kg/m     Wt Readings from Last 3 Encounters:  01/17/21 227 lb 9.6 oz (103.2 kg)  07/26/19 202 lb (91.6 kg)  06/19/19 202 lb (91.6 kg)     GEN:  Well nourished, well developed in no acute distress HEENT: Normal NECK: No JVD; No carotid bruits CARDIAC: RRR, no murmurs, rubs, gallops RESPIRATORY:  Clear to auscultation without rales, wheezing or rhonchi  ABDOMEN: Soft, non-tender, non-distended MUSCULOSKELETAL:  No edema; No deformity  SKIN: Warm and dry NEUROLOGIC:  Alert and oriented x 3 PSYCHIATRIC:  Normal affect   ASSESSMENT:    1. Palpitations   2. Thyroid cancer ([place])    PLAN:    In order of problems listed above:  #Palpitations: Patient with intermittent palpitations since adjusting her synthroid dosing. Would expect her symptoms to correlate with a higher dose of synthroid, however, he synthroid has been lowered with TSH currently in normal range of 2.33. Each palpitation episode only lasts a couple of seconds before going away. No associated symptoms. No known history of cardiac disease or arrhythmias. No significant caffeine use and has tried to increase her hydration. -Check 2 week zio monitor -TSH wnl  -Not  anemic -Continue to encourage increased fluid intake -She has decreased her caffeine uptake  #Thyroid Cancer s/p Thyroidectomy and XRT: Managed by Endocrinology. Recent TSH 2.33 -Follow-up with endocrinology as scheduled   Medication Adjustments/Labs and Tests Ordered: Current medicines are reviewed at length with the patient today.  Concerns regarding medicines are outlined above.  Orders Placed This Encounter  Procedures  . LONG TERM MONITOR (3-14 DAYS)   No orders of the defined types were placed in this encounter.   Patient Instructions  Medication Instructions:  Your physician recommends that you continue on your current medications as directed. Please refer to the Current Medication list given to you today.  *If you need a refill on your cardiac medications before your next appointment, please call your pharmacy*   Lab Work: None ordered   If you have labs (blood work) drawn today and your tests are completely  normal, you will receive your results only by: Marland Kitchen MyChart Message (if you have MyChart) OR . A paper copy in the mail If you have any lab test that is abnormal or we need to change your treatment, we will call you to review the results.   Testing/Procedures: Bryn Gulling- Long Term Monitor Instructions   Your physician has requested you wear your ZIO patch monitor 14 days.   This is a single patch monitor.  [employer] supplies one patch monitor per enrollment.  Additional stickers are not available.   Please do not apply patch if you will be having a Nuclear Stress Test, Echocardiogram, Cardiac CT, MRI, or Chest Xray during the time frame you would be wearing the monitor. The patch cannot be worn during these tests.  You cannot remove and re-apply the ZIO XT patch monitor.   Your ZIO patch monitor will be sent USPS Priority mail from [facility] directly to your home address. The monitor may also be mailed to a PO BOX if home delivery is not available.  It may  take 3-5 days to receive your monitor after you have been enrolled.   Once you have received you monitor, please review enclosed instructions.  Your monitor has already been registered assigning a specific monitor serial # to you.   Applying the monitor   Shave hair from upper left chest.   Hold abrader disc by orange tab.  Rub abrader in 40 strokes over left upper chest as indicated in your monitor instructions.   Clean area with 4 enclosed alcohol pads .  Use all pads to assure are is cleaned thoroughly.  Let dry.   Apply patch as indicated in monitor instructions.  Patch will be place under collarbone on left side of chest with arrow pointing upward.   Rub patch adhesive wings for 2 minutes.Remove white label marked "1".  Remove white label marked "2".  Rub patch adhesive wings for 2 additional minutes.   While looking in a mirror, press and release button in center of patch.  A small green light will flash 3-4 times .  This will be your only indicator the monitor has been turned on.     Do not shower for the first 24 hours.  You may shower after the first 24 hours.   Press button if you feel a symptom. You will hear a small click.  Record Date, Time and Symptom in the Patient Log Book.   When you are ready to remove patch, follow instructions on last 2 pages of Patient Log Book.  Stick patch monitor onto last page of Patient Log Book.   Place Patient Log Book in [place] box.  Use locking tab on box and tape box closed securely.  The [employer] and [employer] has [employer] on it.  Please place in mailbox as soon as possible.  Your physician should have your test results approximately 7 days after the monitor has been mailed back to [facility].   Call [facility] at [phone] if you have questions regarding your ZIO XT patch monitor.  Call them immediately if you see an orange light blinking on your monitor.   If your monitor falls off in less than 4 days contact  our Monitor department at [phone].  If your monitor becomes loose or falls off after 4 days call [employer] at [phone] for suggestions on securing your monitor.     Follow-Up: At [facility], you and your health needs are our  priority.  As part of our continuing mission to provide you with exceptional heart care, we have created designated Provider Care Teams.  These Care Teams include your primary Cardiologist (physician) and Advanced Practice Providers (APPs -  Physician Assistants and Nurse Practitioners) who all work together to provide you with the care you need, when you need it.  We recommend signing up for the patient portal called "MyChart".  Sign up information is provided on this After Visit Summary.  MyChart is used to connect with patients for Virtual Visits (Telemedicine).  Patients are able to view lab/test results, encounter notes, upcoming appointments, etc.  Non-urgent messages can be sent to your provider as well.   To learn more about what you can do with MyChart, go to [email or web address].    Your next appointment:   3 month(s)  The format for your next appointment:   In Person  Provider:   You may see Dr. Gwyndolyn Kaufman or one of the following Advanced Practice Providers on your designated Care Team:    Richardson Dopp, PA-C  Robbie Lis, [place]    Other Instructions None      Signed, Freada Bergeron, MD  01/17/2021 2:13 PM    [place]

## 2021-01-14 ENCOUNTER — Ambulatory Visit: Payer: Self-pay | Admitting: Cardiology

## 2021-01-17 ENCOUNTER — Other Ambulatory Visit: Payer: Self-pay

## 2021-01-17 ENCOUNTER — Ambulatory Visit (INDEPENDENT_AMBULATORY_CARE_PROVIDER_SITE_OTHER): Payer: Commercial Managed Care - PPO | Admitting: Cardiology

## 2021-01-17 ENCOUNTER — Ambulatory Visit (INDEPENDENT_AMBULATORY_CARE_PROVIDER_SITE_OTHER): Payer: Commercial Managed Care - PPO

## 2021-01-17 ENCOUNTER — Encounter: Payer: Self-pay | Admitting: Cardiology

## 2021-01-17 VITALS — BP 130/84 | HR 89 | Ht 63.5 in | Wt 227.6 lb

## 2021-01-17 DIAGNOSIS — C73 Malignant neoplasm of thyroid gland: Secondary | ICD-10-CM | POA: Diagnosis not present

## 2021-01-17 DIAGNOSIS — R002 Palpitations: Secondary | ICD-10-CM

## 2021-01-17 NOTE — Patient Instructions (Signed)
Medication Instructions:  Your physician recommends that you continue on your current medications as directed. Please refer to the Current Medication list given to you today.  *If you need a refill on your cardiac medications before your next appointment, please call your pharmacy*   Lab Work: None ordered   If you have labs (blood work) drawn today and your tests are completely normal, you will receive your results only by: Marland Kitchen MyChart Message (if you have MyChart) OR . A paper copy in the mail If you have any lab test that is abnormal or we need to change your treatment, we will call you to review the results.   Testing/Procedures: Bryn Gulling- Long Term Monitor Instructions   Your physician has requested you wear your ZIO patch monitor 14 days.   This is a single patch monitor.  [employer] supplies one patch monitor per enrollment.  Additional stickers are not available.   Please do not apply patch if you will be having a Nuclear Stress Test, Echocardiogram, Cardiac CT, MRI, or Chest Xray during the time frame you would be wearing the monitor. The patch cannot be worn during these tests.  You cannot remove and re-apply the ZIO XT patch monitor.   Your ZIO patch monitor will be sent USPS Priority mail from [facility] directly to your home address. The monitor may also be mailed to a PO BOX if home delivery is not available.  It may take 3-5 days to receive your monitor after you have been enrolled.   Once you have received you monitor, please review enclosed instructions.  Your monitor has already been registered assigning a specific monitor serial # to you.   Applying the monitor   Shave hair from upper left chest.   Hold abrader disc by orange tab.  Rub abrader in 40 strokes over left upper chest as indicated in your monitor instructions.   Clean area with 4 enclosed alcohol pads .  Use all pads to assure are is cleaned thoroughly.  Let dry.   Apply patch as indicated in  monitor instructions.  Patch will be place under collarbone on left side of chest with arrow pointing upward.   Rub patch adhesive wings for 2 minutes.Remove white label marked "1".  Remove white label marked "2".  Rub patch adhesive wings for 2 additional minutes.   While looking in a mirror, press and release button in center of patch.  A small green light will flash 3-4 times .  This will be your only indicator the monitor has been turned on.     Do not shower for the first 24 hours.  You may shower after the first 24 hours.   Press button if you feel a symptom. You will hear a small click.  Record Date, Time and Symptom in the Patient Log Book.   When you are ready to remove patch, follow instructions on last 2 pages of Patient Log Book.  Stick patch monitor onto last page of Patient Log Book.   Place Patient Log Book in [place] box.  Use locking tab on box and tape box closed securely.  The [employer] has [employer] on it.  Please place in mailbox as soon as possible.  Your physician should have your test results approximately 7 days after the monitor has been mailed back to [facility].   Call [facility] at [phone] if you have questions regarding your ZIO XT patch monitor.  Call them immediately if  you see an orange light blinking on your monitor.   If your monitor falls off in less than 4 days contact our Monitor department at [phone].  If your monitor becomes loose or falls off after 4 days call [employer] at [phone] for suggestions on securing your monitor.     Follow-Up: At [facility], you and your health needs are our priority.  As part of our continuing mission to provide you with exceptional heart care, we have created designated Provider Care Teams.  These Care Teams include your primary Cardiologist (physician) and Advanced Practice Providers (APPs -  Physician Assistants and Nurse Practitioners) who all work together to provide  you with the care you need, when you need it.  We recommend signing up for the patient portal called "MyChart".  Sign up information is provided on this After Visit Summary.  MyChart is used to connect with patients for Virtual Visits (Telemedicine).  Patients are able to view lab/test results, encounter notes, upcoming appointments, etc.  Non-urgent messages can be sent to your provider as well.   To learn more about what you can do with MyChart, go to [email or web address].    Your next appointment:   3 month(s)  The format for your next appointment:   In Person  Provider:   You may see Dr. Gwyndolyn Kaufman or one of the following Advanced Practice Providers on your designated Care Team:    Richardson Dopp, PA-C  Robbie Lis, [place]    Other Instructions None

## 2021-02-14 ENCOUNTER — Telehealth: Payer: Self-pay

## 2021-02-14 NOTE — Telephone Encounter (Signed)
-----   Message from Freada Bergeron, MD sent at 02/14/2021  1:18 PM EST ----- Her monitor shows that she has no significant arrhythmias. Most of her triggered events correlated with extra beats from the bottom chamber of the heart called premature ventricular contractions. While these were rare on her monitor (<1%), she was symptomatic when they did occur. These are not harmful but if they are bothersome, we can start her on a low dose beta blocker to help suppress the beats further.

## 2021-02-14 NOTE — Telephone Encounter (Signed)
Left patient a message to call office back regarding results.

## 2021-02-14 NOTE — Telephone Encounter (Signed)
The patient has been notified of the result and verbalized understanding.  All questions (if any) were answered. Wilma Flavin, RN 02/14/2021 2:34 PM

## 2021-02-14 NOTE — Telephone Encounter (Signed)
Patient returning call.

## 2021-04-21 NOTE — Progress Notes (Signed)
Cardiology Office Note:    Date:  04/29/2021   ID:  Brianna Mcintyre, DOB 11-Feb-1956, MRN [id number]  PCP:  Deland Pretty, MD  [facility] Cardiologist:  None  [facility] Electrophysiologist:  None   Referring MD: Deland Pretty, MD     History of Present Illness:    Brianna Mcintyre is a 65 y.o. female with a hx of thyroid cancer s/p thyroidectomy and GERD who presents to clinic for follow-up.  Last seen on 01/17/21 where she was experiencing palpitations. 2 week monitor with brief runs of SVT and rare PVCs. No sustained arrhythmias or pauses. Patients symptoms mainly correlated with PVCs. She now returns to clinic for follow-up.  Today, the patient states that the palpitations are overall improved. She feels well with no chest pain, shortness of breath, LE edema, lightheadedness of dizziness. TSH is well controlled. Not as active as she would like to be as she is so busy at work. Trialed intermittent fasting which worked well for her but she stopped recently. She is motivated to get back on the diet and lose weight.   Family history notable for paternal grandfather with CAD s/p bypass. Mother with cardiomegaly. Brother with atrial fibrillation.    Past Medical History:  Diagnosis Date  . Allergy    seasonal  . GERD (gastroesophageal reflux disease)   . History of colon polyps   . Thyroid cancer [facility])     Past Surgical History:  Procedure Laterality Date  . COLONOSCOPY    . THYROID SURGERY  1993  . UPPER GI ENDOSCOPY      Current Medications: Current Meds  Medication Sig  . albuterol (VENTOLIN HFA) 108 (90 Base) MCG/ACT inhaler as needed.  . Cholecalciferol (VITAMIN D3) 50 MCG (2000 UT) TABS Take 1 tablet by mouth daily.  Marland Kitchen levothyroxine (SYNTHROID, LEVOTHROID) 125 MCG tablet Take 100 mcg by mouth daily before breakfast.   . MAGNESIUM PO Take by mouth. Takes occassionally     Allergies:   Clarithromycin and Codeine   Social History   Socioeconomic History  .  Marital status: Divorced    Spouse name: Not on file  . Number of children: 4  . Years of education: Not on file  . Highest education level: Not on file  Occupational History  . Occupation: DIRECTOR OF Scientist, clinical (histocompatibility and immunogenetics): [employer]  Tobacco Use  . Smoking status: Never Smoker  . Smokeless tobacco: Never Used  Vaping Use  . Vaping Use: Never used  Substance and Sexual Activity  . Alcohol use: Yes    Comment: rarely  . Drug use: No  . Sexual activity: Not on file  Other Topics Concern  . Not on file  Social History Narrative  . Not on file   Social Determinants of Health   Financial Resource Strain: Not on file  Food Insecurity: Not on file  Transportation Needs: Not on file  Physical Activity: Not on file  Stress: Not on file  Social Connections: Not on file     Family History: The patient's family history includes Heart disease in her mother; Thyroid cancer in her mother. There is no history of Colon cancer, Esophageal cancer, Pancreatic cancer, Rectal cancer, or Stomach cancer.  ROS:   Please see the history of present illness.    Review of Systems  Constitutional: Negative for chills and fever.  HENT: Positive for sinus pain. Negative for congestion and hearing loss.   Eyes: Negative for blurred vision.  Respiratory: Positive for  sputum production. Negative for shortness of breath.   Cardiovascular: Positive for palpitations. Negative for chest pain, orthopnea, claudication, leg swelling and PND.  Gastrointestinal: Positive for heartburn. Negative for nausea and vomiting.  Genitourinary: Negative for hematuria.  Musculoskeletal: Negative for falls.  Neurological: Negative for dizziness and loss of consciousness.  Endo/Heme/Allergies: Negative for polydipsia.  Psychiatric/Behavioral: Negative for substance abuse.    EKGs/Labs/Other Studies Reviewed:    The following studies were reviewed today: Cardiac Monitor 02/14/21:  Patch wear time was 14  days.  Predominant rhythm was normal sinus with avergae HR 75bpm ranigng from 55bpm to 184bpm  There were 3 episodes of non-sustained SVT with fastest lasting 4 beats at 184bpm and longest lasting 16beats at 101bpm.  PVCs were rare (<1%), however, the vast majority of patient triggered events correlated with PVCs  Rare PACs.  No Afib, NSVT/VT or significant pauses    Recent Labs: No results found for requested labs within last 8760 hours.  Recent Lipid Panel No results found for: CHOL, TRIG, HDL, CHOLHDL, VLDL, LDLCALC, LDLDIRECT    Physical Exam:    VS:  BP 130/80   Pulse 91   Ht 5' 3.5" (1.613 m)   Wt 232 lb 9.6 oz (105.5 kg)   SpO2 98%   BMI 40.56 kg/m     Wt Readings from Last 3 Encounters:  04/29/21 232 lb 9.6 oz (105.5 kg)  01/17/21 227 lb 9.6 oz (103.2 kg)  07/26/19 202 lb (91.6 kg)     GEN:  Comfortable, well appearing HEENT: Normal NECK: No JVD; No carotid bruits CARDIAC: RRR, no murmurs, rubs, gallops RESPIRATORY:  Clear to auscultation without rales, wheezing or rhonchi  ABDOMEN: Soft, non-tender, non-distended MUSCULOSKELETAL:  No edema; No deformity  SKIN: Warm and dry NEUROLOGIC:  Alert and oriented x 3 PSYCHIATRIC:  Normal affect   ASSESSMENT:    1. Palpitations   2. Hyperlipidemia, unspecified hyperlipidemia type   3. Thyroid cancer ([place])    PLAN:    In order of problems listed above:  #Palpitations: Monitor showed 3 runs of nonsustained SVT, longest 16 beats and rare PVCs. Patient triggered events mainly correlated with PVCs. No other significant arrhythmias or pauses -Monitor reassuring with no significant arrhythmias -Continue fluid intake and avoiding caffeine -TSH wnl  #Thyroid Cancer s/p Thyroidectomy and XRT: Managed by Endocrinology. Recent TSH 2.33 -Follow-up with endocrinology as scheduled  #Elevated LDL: LDL 113. Not currently on statin therapy. Will check calcium score for risk stratification -Continue diet and  lifestyle modifications -Coronary calcium score  Exercise recommendations: Goal of exercising for at least 30 minutes a day, at least 5 times per week.  Please exercise to a moderate exertion.  This means that while exercising it is difficult to speak in full sentences, however you are not so short of breath that you feel you must stop, and not so comfortable that you can carry on a full conversation.  Exertion level should be approximately a 5/10, if 10 is the most exertion you can perform.  Diet recommendations: Recommend a heart healthy diet such as the Mediterranean diet.  This diet consists of plant based foods, healthy fats, lean meats, olive oil.  It suggests limiting the intake of simple carbohydrates such as white breads, pastries, and pastas.  It also limits the amount of red meat, wine, and dairy products such as cheese that one should consume on a daily basis.   Medication Adjustments/Labs and Tests Ordered: Current medicines are reviewed at length with the patient  today.  Concerns regarding medicines are outlined above.  Orders Placed This Encounter  Procedures  . CT CARDIAC SCORING (SELF PAY ONLY)   No orders of the defined types were placed in this encounter.   Patient Instructions  Medication Instructions:   Your physician recommends that you continue on your current medications as directed. Please refer to the Current Medication list given to you today.  *If you need a refill on your cardiac medications before your next appointment, please call your pharmacy*   Testing/Procedures:  CARDIAC CALCIUM SCORE TO BE DONE HERE IN THE OFFICE--SELF PAY   Follow-Up: At [facility], you and your health needs are our priority.  As part of our continuing mission to provide you with exceptional heart care, we have created designated Provider Care Teams.  These Care Teams include your primary Cardiologist (physician) and Advanced Practice Providers (APPs -  Physician Assistants  and Nurse Practitioners) who all work together to provide you with the care you need, when you need it.  We recommend signing up for the patient portal called "MyChart".  Sign up information is provided on this After Visit Summary.  MyChart is used to connect with patients for Virtual Visits (Telemedicine).  Patients are able to view lab/test results, encounter notes, upcoming appointments, etc.  Non-urgent messages can be sent to your provider as well.   To learn more about what you can do with MyChart, go to [email or web address].    Your next appointment:   6 month(s)  The format for your next appointment:   In Person  Provider:   You will see one of the following Advanced Practice Providers on your designated Care Team:    Richardson Dopp, PA-C  Robbie Lis, [place]  Dayna Dunn PA-C  Ermalinda Barrios PA-C  Kathyrn Drown NP  Cecilie Kicks NP     Signed, Freada Bergeron, MD  04/29/2021 9:37 AM    [place]

## 2021-04-29 ENCOUNTER — Ambulatory Visit (INDEPENDENT_AMBULATORY_CARE_PROVIDER_SITE_OTHER): Payer: Commercial Managed Care - PPO | Admitting: Cardiology

## 2021-04-29 ENCOUNTER — Other Ambulatory Visit: Payer: Self-pay

## 2021-04-29 ENCOUNTER — Encounter: Payer: Self-pay | Admitting: Cardiology

## 2021-04-29 VITALS — BP 130/80 | HR 91 | Ht 63.5 in | Wt 232.6 lb

## 2021-04-29 DIAGNOSIS — E785 Hyperlipidemia, unspecified: Secondary | ICD-10-CM | POA: Diagnosis not present

## 2021-04-29 DIAGNOSIS — C73 Malignant neoplasm of thyroid gland: Secondary | ICD-10-CM | POA: Diagnosis not present

## 2021-04-29 DIAGNOSIS — R002 Palpitations: Secondary | ICD-10-CM

## 2021-04-29 NOTE — Patient Instructions (Signed)
Medication Instructions:   Your physician recommends that you continue on your current medications as directed. Please refer to the Current Medication list given to you today.  *If you need a refill on your cardiac medications before your next appointment, please call your pharmacy*   Testing/Procedures:  CARDIAC CALCIUM SCORE TO BE DONE HERE IN THE OFFICE--SELF PAY   Follow-Up: At [facility], you and your health needs are our priority.  As part of our continuing mission to provide you with exceptional heart care, we have created designated Provider Care Teams.  These Care Teams include your primary Cardiologist (physician) and Advanced Practice Providers (APPs -  Physician Assistants and Nurse Practitioners) who all work together to provide you with the care you need, when you need it.  We recommend signing up for the patient portal called "MyChart".  Sign up information is provided on this After Visit Summary.  MyChart is used to connect with patients for Virtual Visits (Telemedicine).  Patients are able to view lab/test results, encounter notes, upcoming appointments, etc.  Non-urgent messages can be sent to your provider as well.   To learn more about what you can do with MyChart, go to [email or web address].    Your next appointment:   6 month(s)  The format for your next appointment:   In Person  Provider:   You will see one of the following Advanced Practice Providers on your designated Care Team:    Richardson Dopp, PA-C  Robbie Lis, [place]  Dayna Dunn PA-C  Ermalinda Barrios PA-C  Kathyrn Drown NP  Cecilie Kicks NP

## 2021-05-30 ENCOUNTER — Other Ambulatory Visit: Payer: Self-pay

## 2021-05-30 ENCOUNTER — Ambulatory Visit (INDEPENDENT_AMBULATORY_CARE_PROVIDER_SITE_OTHER)
Admission: RE | Admit: 2021-05-30 | Discharge: 2021-05-30 | Disposition: A | Discharge status: Home / Self Care | Payer: Self-pay | Source: Ambulatory Visit | Attending: Cardiology | Admitting: Cardiology

## 2021-05-30 DIAGNOSIS — E785 Hyperlipidemia, unspecified: Secondary | ICD-10-CM

## 2021-11-24 ENCOUNTER — Other Ambulatory Visit: Payer: Self-pay | Admitting: Obstetrics and Gynecology

## 2021-11-24 DIAGNOSIS — Z1231 Encounter for screening mammogram for malignant neoplasm of breast: Secondary | ICD-10-CM

## 2021-11-28 ENCOUNTER — Ambulatory Visit
Admission: RE | Admit: 2021-11-28 | Discharge: 2021-11-28 | Disposition: A | Discharge status: Home / Self Care | Payer: Commercial Managed Care - PPO | Source: Ambulatory Visit | Attending: Obstetrics and Gynecology | Admitting: Obstetrics and Gynecology

## 2021-11-28 DIAGNOSIS — Z1231 Encounter for screening mammogram for malignant neoplasm of breast: Secondary | ICD-10-CM

## 2021-12-25 ENCOUNTER — Ambulatory Visit: Payer: Commercial Managed Care - PPO

## 2022-04-28 IMAGING — MG MM DIGITAL SCREENING BILAT W/ TOMO AND CAD
6 of 12 series · 6 of 36 positions shown · non-contrast
Comparison: Previous exam(s).

CLINICAL DATA: Screening.

EXAM:
DIGITAL SCREENING BILATERAL MAMMOGRAM WITH TOMOSYNTHESIS AND CAD
TECHNIQUE: Bilateral screening digital craniocaudal and mediolateral oblique
mammograms were obtained. Bilateral screening digital breast
tomosynthesis was performed. The images were evaluated with
computer-aided detection.

[R CC synth-2D (1 of 2)]
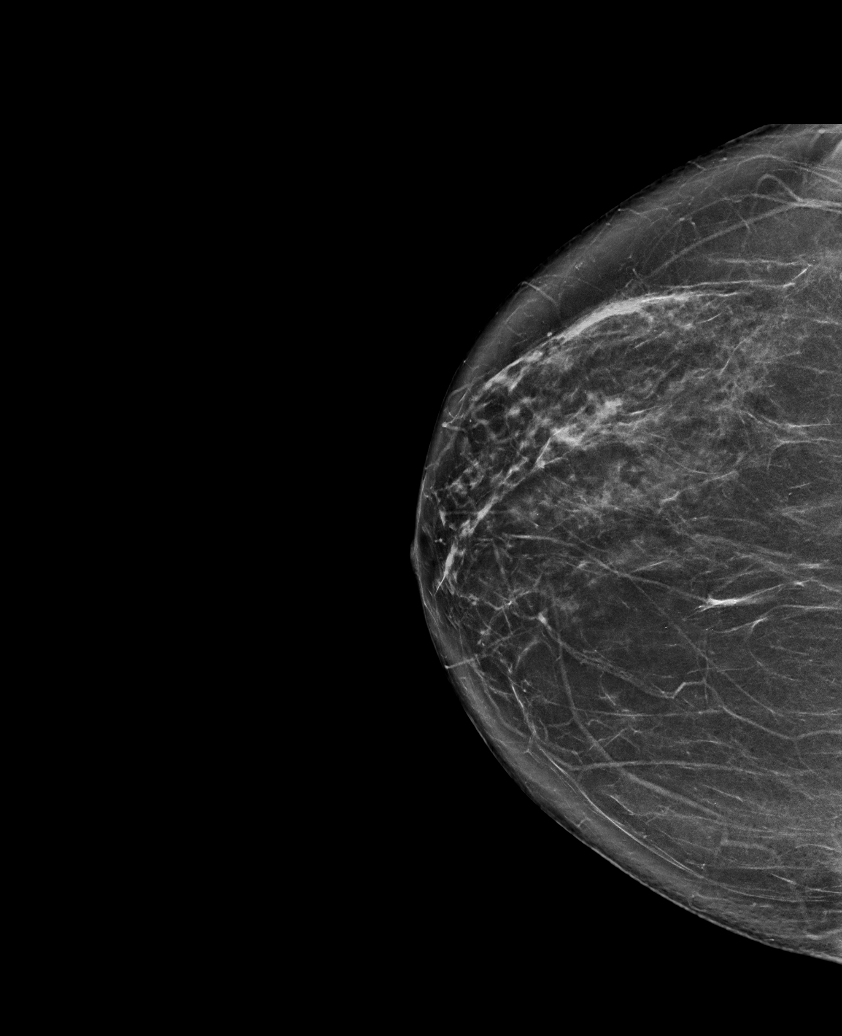

[L CC synth-2D]
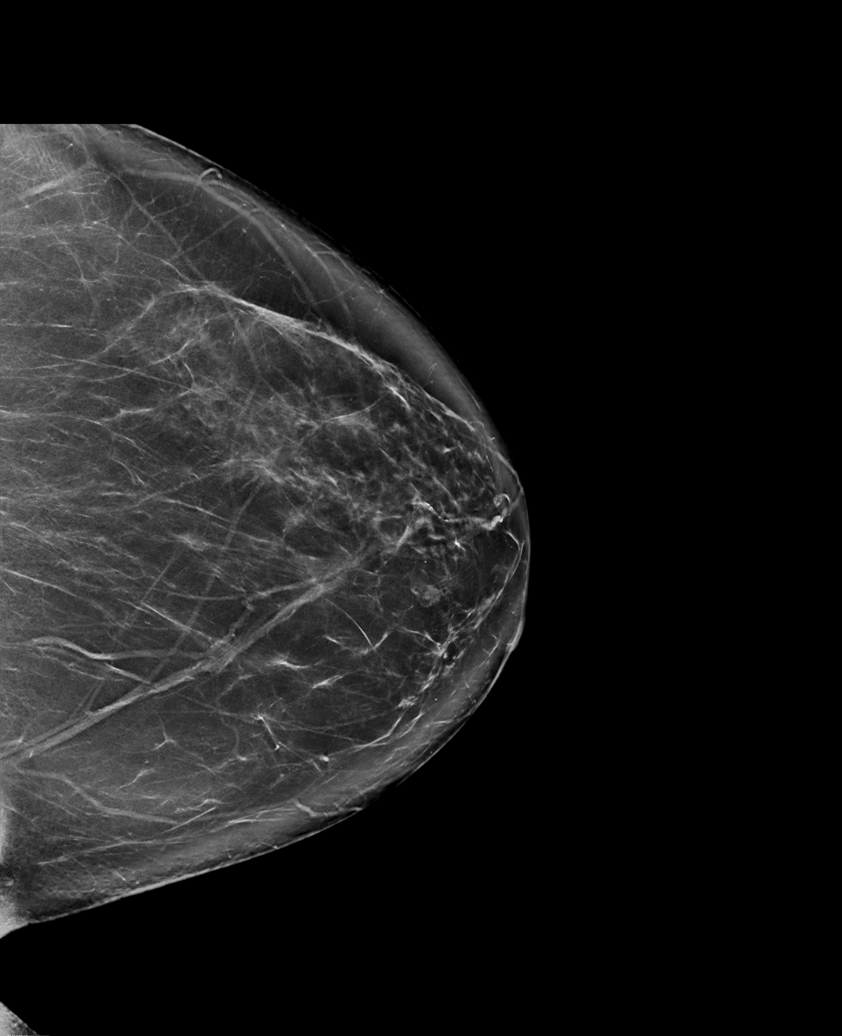

[L MLO synth-2D]
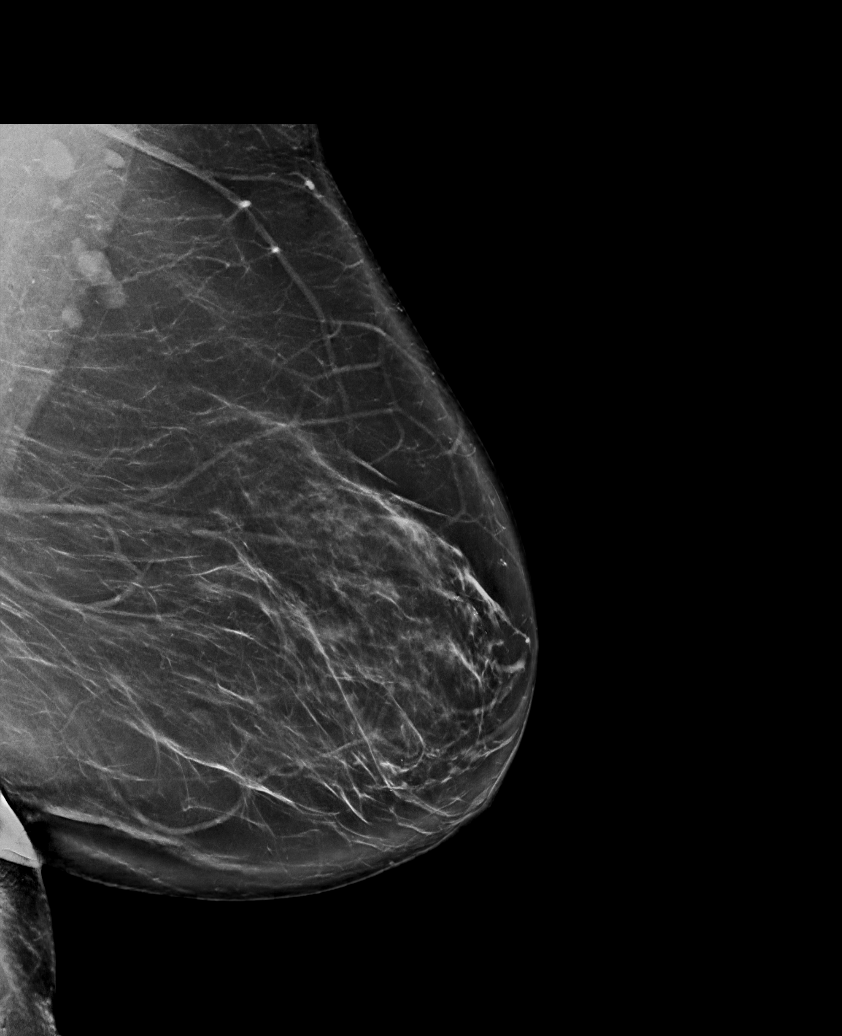

[R MLO synth-2D (1 of 2)]
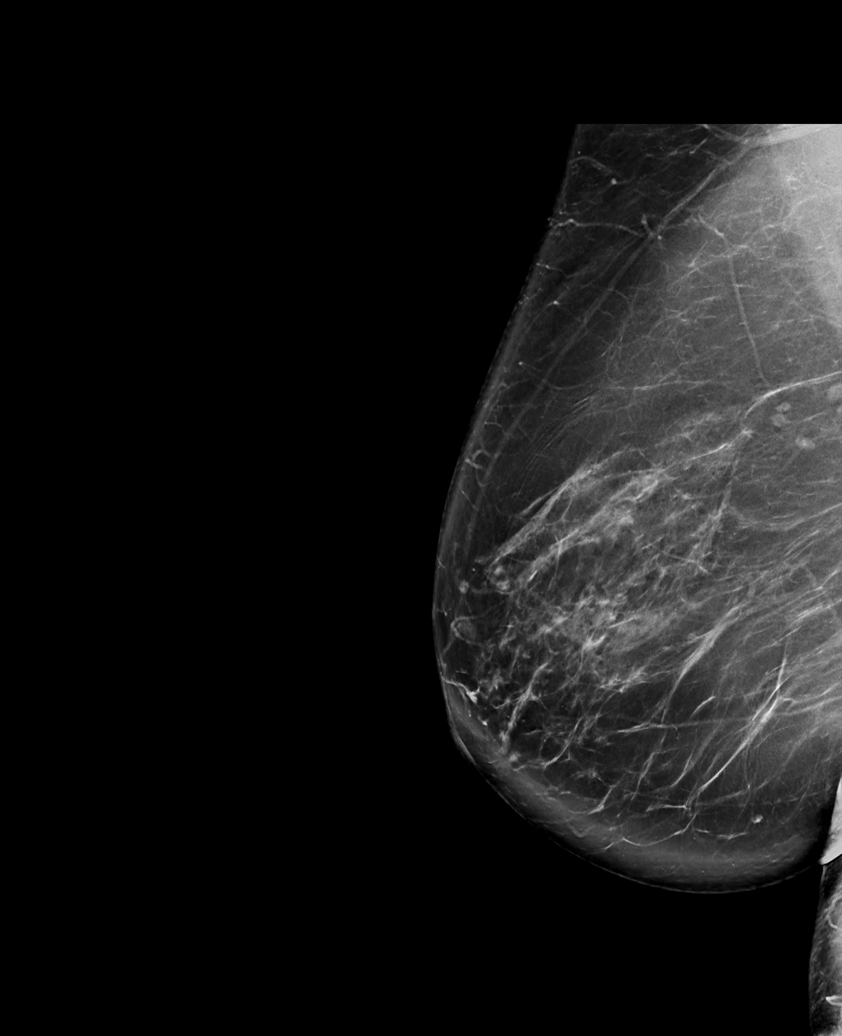

[R CC synth-2D (2 of 2)]
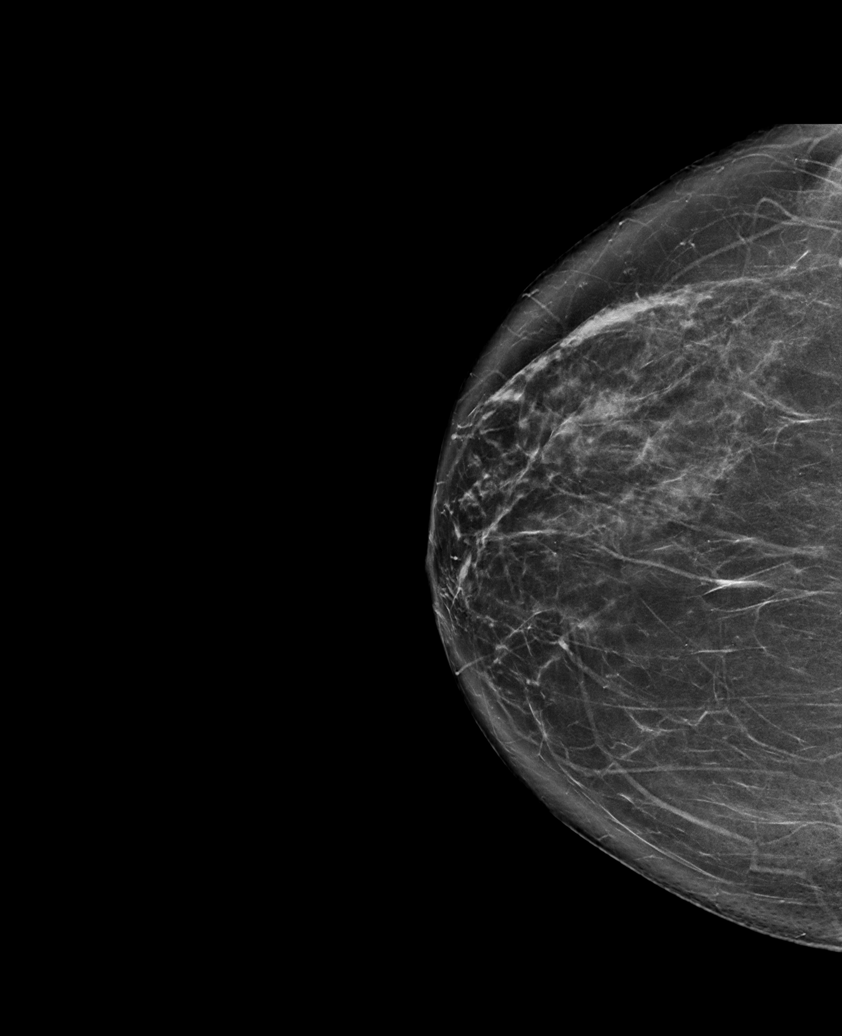

[R MLO synth-2D (2 of 2)]
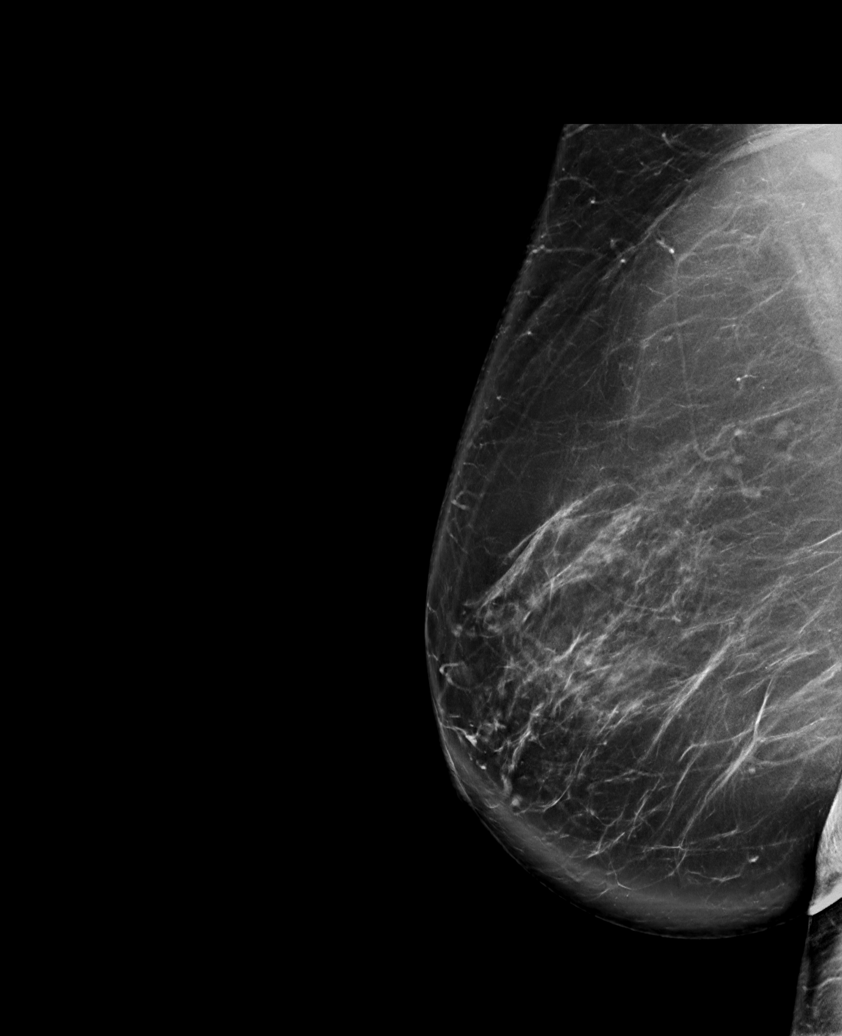

[6 of 36 positions shown; findings below may reference images not displayed]

ACR Breast Density Category b: There are scattered areas of
fibroglandular density.
FINDINGS: There are no findings suspicious for malignancy.
IMPRESSION: No mammographic evidence of malignancy. A result letter of this
screening mammogram will be mailed directly to the patient.

RECOMMENDATION:
Screening mammogram in one year. (Code:51-O-LD2)

BI-RADS CATEGORY  1: Negative.

## 2022-12-03 ENCOUNTER — Encounter: Payer: Self-pay | Admitting: Cardiovascular Disease

## 2023-03-24 ENCOUNTER — Other Ambulatory Visit: Payer: Self-pay | Admitting: Obstetrics and Gynecology

## 2023-03-24 DIAGNOSIS — Z1231 Encounter for screening mammogram for malignant neoplasm of breast: Secondary | ICD-10-CM

## 2023-05-06 ENCOUNTER — Ambulatory Visit
Admission: RE | Admit: 2023-05-06 | Discharge: 2023-05-06 | Disposition: A | Discharge status: Home / Self Care | Payer: Commercial Managed Care - PPO | Source: Ambulatory Visit | Attending: Obstetrics and Gynecology | Admitting: Obstetrics and Gynecology

## 2023-05-06 DIAGNOSIS — Z1231 Encounter for screening mammogram for malignant neoplasm of breast: Secondary | ICD-10-CM

## 2023-05-16 NOTE — Progress Notes (Signed)
Cardiology Office Note:    Date:  05/20/2023   ID:  Brianna Mcintyre, DOB Jul 05, 1956, MRN [id number]  PCP:  Merri Brunette, MD  [facility] Cardiologist:  None  [facility] Electrophysiologist:  None   Referring MD: Merri Brunette, MD     History of Present Illness:    Brianna Mcintyre is a 67 y.o. female with a hx of thyroid cancer s/p thyroidectomy and GERD who presents to clinic for follow-up.  Seen in 01/17/21 where she was experiencing palpitations. 2 week monitor with brief runs of SVT and rare PVCs. No sustained arrhythmias or pauses. Patients symptoms mainly correlated with PVCs. She now returns to clinic for follow-up.  Was last seen in clinic on 04/2021 where she was stable from a CV standpoint. Ca score 05/2021 0.  Today, the patient overall feels well. States she had an episode of flutters back in the middle of May that lasted intermittently for about 2 days and then resolved. She was dehydrated at that time which she thinks triggered the event. No significant palpitations over the past several days. Also notes occasional chest discomfort/ numbness/tingling sensation across her upper chest and down her arms that occurs with exertion. Only occurs intermittently and she thinks it may related to an old shoulder injury but she is concerned it is related to her heart given her strong family history of CAD.  Otherwise, no LE edema, orthopnea, SOB, lightheadedness, dizziness or syncope.   Past Medical History:  Diagnosis Date   Allergy    seasonal   GERD (gastroesophageal reflux disease)    History of colon polyps    Thyroid cancer [facility])     Past Surgical History:  Procedure Laterality Date   COLONOSCOPY     THYROID SURGERY  1993   UPPER GI ENDOSCOPY      Current Medications: Current Meds  Medication Sig   albuterol (VENTOLIN HFA) 108 (90 Base) MCG/ACT inhaler as needed.   Cholecalciferol (VITAMIN D3) 50 MCG (2000 UT) TABS Take 1 tablet by mouth daily.   famotidine  (PEPCID) 20 MG tablet Take 25 mg by mouth as needed for heartburn or indigestion.   ibuprofen (ADVIL) 200 MG tablet Take 200 mg by mouth as needed.   levothyroxine (SYNTHROID, LEVOTHROID) 125 MCG tablet Take 100 mcg by mouth daily before breakfast.    MAGNESIUM PO Take by mouth. Takes occassionally   metoprolol tartrate (LOPRESSOR) 100 MG tablet Take 1 tablet (100 mg total) by mouth once for 1 dose. Take 1 tablet (100 mg total) two hours prior to CT scan.     Allergies:   Clarithromycin and Codeine   Social History   Socioeconomic History   Marital status: Divorced    Spouse name: Not on file   Number of children: 4   Years of education: Not on file   Highest education level: Not on file  Occupational History   Occupation: DIRECTOR OF SALES    Employer: [employer]  Tobacco Use   Smoking status: Never   Smokeless tobacco: Never  Vaping Use   Vaping Use: Never used  Substance and Sexual Activity   Alcohol use: Yes    Comment: rarely   Drug use: No   Sexual activity: Not on file  Other Topics Concern   Not on file  Social History Narrative   Not on file   Social Determinants of Health   Financial Resource Strain: Not on file  Food Insecurity: Not on file  Transportation Needs: Not on  file  Physical Activity: Not on file  Stress: Not on file  Social Connections: Not on file     Family History: The patient's family history includes Heart disease in her mother; Thyroid cancer in her mother. There is no history of Colon cancer, Esophageal cancer, Pancreatic cancer, Rectal cancer, or Stomach cancer.  ROS:   Please see the history of present illness.      EKGs/Labs/Other Studies Reviewed:    The following studies were reviewed today: Cardiac Monitor 02/14/21: Patch wear time was 14 days. Predominant rhythm was normal sinus with avergae HR 75bpm ranigng from 55bpm to 184bpm There were 3 episodes of non-sustained SVT with fastest lasting 4 beats at 184bpm and longest  lasting 16beats at 101bpm. PVCs were rare (<1%), however, the vast majority of patient triggered events correlated with PVCs Rare PACs. No Afib, NSVT/VT or significant pauses   ECG: NSR, HR 78-personally reviewed  Recent Labs: No results found for requested labs within last 365 days.  Recent Lipid Panel No results found for: "CHOL", "TRIG", "HDL", "CHOLHDL", "VLDL", "LDLCALC", "LDLDIRECT"    Physical Exam:    VS:  BP 130/88   Pulse 78   Ht 5' 3.5" (1.613 m)   Wt 229 lb 12.8 oz (104.2 kg)   SpO2 98%   BMI 40.07 kg/m     Wt Readings from Last 3 Encounters:  05/20/23 229 lb 12.8 oz (104.2 kg)  04/29/21 232 lb 9.6 oz (105.5 kg)  01/17/21 227 lb 9.6 oz (103.2 kg)     GEN:  Comfortable, well appearing HEENT: Normal NECK: No JVD; No carotid bruits CARDIAC: RRR, no murmurs, rubs, gallops RESPIRATORY:  CTAB, no wheezing ABDOMEN: Soft, non-tender, non-distended MUSCULOSKELETAL:  No edema; No deformity  SKIN: Warm and dry NEUROLOGIC:  Alert and oriented x 3 PSYCHIATRIC:  Normal affect   ASSESSMENT:    1. Chest pain of uncertain etiology   2. Palpitations   3. Thyroid cancer ([facility])   4. Pure hypercholesterolemia   5. Precordial pain    PLAN:    In order of problems listed above:  #Chest Discomfort: -Patient states that she has chest discomfort radiating down her arms with exertion -Ca score 0 in 2022 and suspect this is likely MSK in nature but she is very concerned about her symptoms and family history and therefore, will check coronary CTA for further evaluation  #Palpitations: Monitor showed 3 runs of nonsustained SVT, longest 16 beats and rare PVCs. Patient triggered events mainly correlated with PVCs. No other significant arrhythmias or pauses -Monitor reassuring with no significant arrhythmias -Will continue to monitor frequency of palpitations and can repeat monitor as needed if symptoms worsen -Continue fluid intake and avoiding caffeine -TSH wnl  #Thyroid  Cancer s/p Thyroidectomy and XRT: Managed by Endocrinology. Recent TSH 2.33 -Follow-up with endocrinology as scheduled  #Elevated LDL: Ca score 0. Working on lifestyle modifications. Will add lipid lowering therapy if needed following coronary CTA  Exercise recommendations: Goal of exercising for at least 30 minutes a day, at least 5 times per week.  Please exercise to a moderate exertion.  This means that while exercising it is difficult to speak in full sentences, however you are not so short of breath that you feel you must stop, and not so comfortable that you can carry on a full conversation.  Exertion level should be approximately a 5/10, if 10 is the most exertion you can perform.  Diet recommendations: Recommend a heart healthy diet such as the Mediterranean  diet.  This diet consists of plant based foods, healthy fats, lean meats, olive oil.  It suggests limiting the intake of simple carbohydrates such as white breads, pastries, and pastas.  It also limits the amount of red meat, wine, and dairy products such as cheese that one should consume on a daily basis.   Medication Adjustments/Labs and Tests Ordered: Current medicines are reviewed at length with the patient today.  Concerns regarding medicines are outlined above.  Orders Placed This Encounter  Procedures   CT CORONARY MORPH W/CTA COR W/SCORE W/CA W/CM &/OR WO/CM   Basic metabolic panel   EKG 12-Lead   Meds ordered this encounter  Medications   metoprolol tartrate (LOPRESSOR) 100 MG tablet    Sig: Take 1 tablet (100 mg total) by mouth once for 1 dose. Take 1 tablet (100 mg total) two hours prior to CT scan.    Dispense:  1 tablet    Refill:  0    Patient Instructions  Medication Instructions:  Your physician recommends that you continue on your current medications as directed. Please refer to the Current Medication list given to you today.  *If you need a refill on your cardiac medications before your next  appointment, please call your pharmacy*   Lab Work: BMET If you have labs (blood work) drawn today and your tests are completely normal, you will receive your results only by: MyChart Message (if you have MyChart) OR A paper copy in the mail If you have any lab test that is abnormal or we need to change your treatment, we will call you to review the results.   Testing/Procedures: Your physician has requested that you have cardiac CT. Cardiac computed tomography (CT) is a painless test that uses an x-ray machine to take clear, detailed pictures of your heart. For further information please visit [email or web address] Please follow instruction sheet as given.     Follow-Up: At [facility], you and your health needs are our priority.  As part of our continuing mission to provide you with exceptional heart care, we have created designated Provider Care Teams.  These Care Teams include your primary Cardiologist (physician) and Advanced Practice Providers (APPs -  Physician Assistants and Nurse Practitioners) who all work together to provide you with the care you need, when you need it.  Your next appointment:   6 month(s)  Provider:   Jodelle Red, MD    Other Instructions    [facility] [address] [place], [place] [address] [phone]  Please arrive at the [facility] and Children's Entrance (Entrance C2) of [facility] 30 minutes prior to test start time. You can use the FREE valet parking offered at entrance C (encouraged to control the heart rate for the test)  Proceed to the [facility] Radiology Department (first floor) to check-in and test prep.  All radiology patients and guests should use entrance C2 at [facility], accessed from [facility], even though the hospital's physical address listed is [address]     Please follow these instructions carefully (unless otherwise directed):   On the  Night Before the Test: Be sure to Drink plenty of water. Do not consume any caffeinated/decaffeinated beverages or chocolate 12 hours prior to your test. Do not take any antihistamines 12 hours prior to your test.  On the Day of the Test: Drink plenty of water until 1 hour prior to the test. Do not eat any food 1 hour prior  to test. You may take your regular medications prior to the test.  Take metoprolol (Lopressor) two hours prior to test. FEMALES- please wear underwire-free bra if available, avoid dresses & tight clothing       After the Test: Drink plenty of water. After receiving IV contrast, you may experience a mild flushed feeling. This is normal. On occasion, you may experience a mild rash up to 24 hours after the test. This is not dangerous. If this occurs, you can take Benadryl 25 mg and increase your fluid intake. If you experience trouble breathing, this can be serious. If it is severe call 911 IMMEDIATELY. If it is mild, please call our office. If you take any of these medications: Glipizide/Metformin, Avandament, Glucavance, please do not take 48 hours after completing test unless otherwise instructed.  We will call to schedule your test 2-4 weeks out understanding that some insurance companies will need an authorization prior to the service being performed.   For non-scheduling related questions, please contact the cardiac imaging nurse navigator should you have any questions/concerns: [place], Cardiac Imaging Nurse Navigator Larey Brick, Cardiac Imaging Nurse Navigator [facility] Direct Office Dial: [phone]   For scheduling needs, including cancellations and rescheduling, please call [place], [phone].     Signed, Meriam Sprague, MD  05/20/2023 9:47 AM    [facility]

## 2023-05-20 ENCOUNTER — Ambulatory Visit: Payer: Commercial Managed Care - PPO | Attending: Cardiology | Admitting: Cardiology

## 2023-05-20 VITALS — BP 130/88 | HR 78 | Ht 63.5 in | Wt 229.8 lb

## 2023-05-20 DIAGNOSIS — C73 Malignant neoplasm of thyroid gland: Secondary | ICD-10-CM | POA: Diagnosis not present

## 2023-05-20 DIAGNOSIS — R002 Palpitations: Secondary | ICD-10-CM | POA: Diagnosis not present

## 2023-05-20 DIAGNOSIS — E7800 Pure hypercholesterolemia, unspecified: Secondary | ICD-10-CM

## 2023-05-20 DIAGNOSIS — R079 Chest pain, unspecified: Secondary | ICD-10-CM | POA: Diagnosis not present

## 2023-05-20 DIAGNOSIS — R072 Precordial pain: Secondary | ICD-10-CM

## 2023-05-20 LAB — BASIC METABOLIC PANEL
BUN/Creatinine Ratio: 17 (ref 12–28)
BUN: 12 mg/dL (ref 8–27)
CO2: 24 mmol/L (ref 20–29)
Calcium: 9.1 mg/dL (ref 8.7–10.3)
Chloride: 104 mmol/L (ref 96–106)
Creatinine, Ser: 0.7 mg/dL (ref 0.57–1.00)
Glucose: 80 mg/dL (ref 70–99)
Potassium: 5.5 mmol/L — ABNORMAL HIGH (ref 3.5–5.2)
Sodium: 140 mmol/L (ref 134–144)
eGFR: 96 mL/min/{1.73_m2} (ref >59)

## 2023-05-20 MED ORDER — METOPROLOL TARTRATE 100 MG PO TABS: 100.0000 mg | ORAL_TABLET | Freq: Once | ORAL | 0 refills | Status: DC

## 2023-05-20 NOTE — Patient Instructions (Addendum)
Medication Instructions:  Your physician recommends that you continue on your current medications as directed. Please refer to the Current Medication list given to you today.  *If you need a refill on your cardiac medications before your next appointment, please call your pharmacy*   Lab Work: BMET If you have labs (blood work) drawn today and your tests are completely normal, you will receive your results only by: MyChart Message (if you have MyChart) OR A paper copy in the mail If you have any lab test that is abnormal or we need to change your treatment, we will call you to review the results.   Testing/Procedures: Your physician has requested that you have cardiac CT. Cardiac computed tomography (CT) is a painless test that uses an x-ray machine to take clear, detailed pictures of your heart. For further information please visit [email or web address] Please follow instruction sheet as given.     Follow-Up: At [facility], you and your health needs are our priority.  As part of our continuing mission to provide you with exceptional heart care, we have created designated Provider Care Teams.  These Care Teams include your primary Cardiologist (physician) and Advanced Practice Providers (APPs -  Physician Assistants and Nurse Practitioners) who all work together to provide you with the care you need, when you need it.  Your next appointment:   6 month(s)  Provider:   Jodelle Red, MD    Other Instructions    [facility] [address] [place], [place] [address] [phone]  Please arrive at the [facility] (Entrance C2) of [facility] 30 minutes prior to test start time. You can use the FREE valet parking offered at entrance C (encouraged to control the heart rate for the test)  Proceed to the [facility] Radiology Department (first floor) to check-in and test prep.  All radiology patients and guests should use  entrance C2 at [facility], accessed from [facility], even though the hospital's physical address listed is [address]     Please follow these instructions carefully (unless otherwise directed):   On the Night Before the Test: Be sure to Drink plenty of water. Do not consume any caffeinated/decaffeinated beverages or chocolate 12 hours prior to your test. Do not take any antihistamines 12 hours prior to your test.  On the Day of the Test: Drink plenty of water until 1 hour prior to the test. Do not eat any food 1 hour prior to test. You may take your regular medications prior to the test.  Take metoprolol (Lopressor) two hours prior to test. FEMALES- please wear underwire-free bra if available, avoid dresses & tight clothing       After the Test: Drink plenty of water. After receiving IV contrast, you may experience a mild flushed feeling. This is normal. On occasion, you may experience a mild rash up to 24 hours after the test. This is not dangerous. If this occurs, you can take Benadryl 25 mg and increase your fluid intake. If you experience trouble breathing, this can be serious. If it is severe call 911 IMMEDIATELY. If it is mild, please call our office. If you take any of these medications: Glipizide/Metformin, Avandament, Glucavance, please do not take 48 hours after completing test unless otherwise instructed.  We will call to schedule your test 2-4 weeks out understanding that some insurance companies will need an authorization prior to the service being performed.   For non-scheduling related questions,  please contact the cardiac imaging nurse navigator should you have any questions/concerns: Rockwell Alexandria, Cardiac Imaging Nurse Navigator Larey Brick, Cardiac Imaging Nurse Navigator [facility] Direct Office Dial: [phone]   For scheduling needs, including cancellations and rescheduling, please call  [place], [phone].

## 2023-05-21 ENCOUNTER — Telehealth: Payer: Self-pay | Admitting: *Deleted

## 2023-05-21 DIAGNOSIS — Z79899 Other long term (current) drug therapy: Secondary | ICD-10-CM

## 2023-05-21 DIAGNOSIS — E875 Hyperkalemia: Secondary | ICD-10-CM

## 2023-05-21 DIAGNOSIS — Z0189 Encounter for other specified special examinations: Secondary | ICD-10-CM

## 2023-05-21 DIAGNOSIS — R002 Palpitations: Secondary | ICD-10-CM

## 2023-05-21 NOTE — Telephone Encounter (Signed)
The patient has been notified of the result and verbalized understanding.  All questions (if any) were answered.  Advised the pt to really focus on hydrating with water (she states she has not been drinking enough) and really work on reducing the potassium in her diet (pt education provided), then come into the office for repeat BMET in one week, to reassess.  Scheduled the pts repeat BMET in one week for 05/28/23.  Pt verbalized understanding and agrees with this plan.

## 2023-05-21 NOTE — Telephone Encounter (Signed)
-----   Message from Meriam Sprague, MD sent at 05/21/2023  6:49 AM EDT ----- Her K is elevated at 5.5 (previously 4.1). She is not on any medications that I can see that increase K . Can we have her watch her potassium in her diet and repeat BMET next week to see if this is better?   Otherwise, kidney function and other electrolytes look good.

## 2023-05-25 ENCOUNTER — Telehealth (HOSPITAL_COMMUNITY): Payer: Self-pay | Admitting: *Deleted

## 2023-05-25 NOTE — Telephone Encounter (Signed)
Reaching out to patient to offer assistance regarding upcoming cardiac imaging study; pt verbalizes understanding of appt date/time, parking situation and where to check in, pre-test NPO status and medications ordered, and verified current allergies; name and call back number provided for further questions should they arise  Marlaysia Lenig RN Navigator Cardiac Imaging [facility] [phone] office [phone] cell  Patient to take 100mg metoprolol tartrate two hours prior to her cardiac CT scan.  She is aware to arrive at 11am. 

## 2023-05-26 ENCOUNTER — Ambulatory Visit (HOSPITAL_COMMUNITY): Admission: RE | Admit: 2023-05-26 | Payer: Commercial Managed Care - PPO | Source: Ambulatory Visit

## 2023-05-27 ENCOUNTER — Telehealth (HOSPITAL_COMMUNITY): Payer: Self-pay | Admitting: *Deleted

## 2023-05-27 NOTE — Telephone Encounter (Signed)
Calling patient to inform her that her insurance has denied the cardiac CT scan. The insurance company recommended to Nuc Stress test. Informed her that she should follow up with Dr. Shari Prows regarding the next steps. She verbalized understanding. We will be canceling her CCTA appt.  Larey Brick RN Navigator Cardiac Imaging [facility] [phone] Office [phone] Cell

## 2023-05-28 ENCOUNTER — Ambulatory Visit: Payer: Commercial Managed Care - PPO | Attending: Cardiology

## 2023-05-28 ENCOUNTER — Telehealth (HOSPITAL_COMMUNITY): Payer: Self-pay | Admitting: Emergency Medicine

## 2023-05-28 DIAGNOSIS — R002 Palpitations: Secondary | ICD-10-CM

## 2023-05-28 DIAGNOSIS — Z79899 Other long term (current) drug therapy: Secondary | ICD-10-CM

## 2023-05-28 DIAGNOSIS — E875 Hyperkalemia: Secondary | ICD-10-CM

## 2023-05-28 DIAGNOSIS — Z0189 Encounter for other specified special examinations: Secondary | ICD-10-CM

## 2023-05-28 NOTE — Telephone Encounter (Signed)
CCTA insur denial  Appt cancelled Rockwell Alexandria RN Navigator Cardiac Imaging [facility] [phone] Office  [phone] Cell

## 2023-05-29 LAB — BASIC METABOLIC PANEL
BUN/Creatinine Ratio: 14 (ref 12–28)
BUN: 10 mg/dL (ref 8–27)
CO2: 24 mmol/L (ref 20–29)
Calcium: 9.2 mg/dL (ref 8.7–10.3)
Chloride: 102 mmol/L (ref 96–106)
Creatinine, Ser: 0.73 mg/dL (ref 0.57–1.00)
Glucose: 86 mg/dL (ref 70–99)
Potassium: 4.4 mmol/L (ref 3.5–5.2)
Sodium: 137 mmol/L (ref 134–144)
eGFR: 91 mL/min/{1.73_m2} (ref >59)

## 2023-05-31 ENCOUNTER — Ambulatory Visit (HOSPITAL_COMMUNITY): Admission: RE | Admit: 2023-05-31 | Payer: Commercial Managed Care - PPO | Source: Ambulatory Visit

## 2023-11-10 NOTE — Progress Notes (Signed)
Cardiology Office Note:  .   Date:  11/11/2023  ID:  Brianna Mcintyre, DOB 1956/11/15, MRN [id number] PCP: Fatima Sanger, FNP  [facility] Providers Cardiologist:  Jodelle Red, MD {  History of Present Illness: .   Brianna Mcintyre is a 67 y.o. female with PMH thyroid cancer s/p thyroidectomy, palpitations. She was previously followed by Dr. Shari Prows and established care with me on 11/11/23.  Pertinent CV history: Seen in 01/17/21 where she was experiencing palpitations. 2 week monitor with brief runs of SVT and rare PVCs. No sustained arrhythmias or pauses. Patients symptoms mainly correlated with PVCs. In 04/2023 has atypical chest pain. Prior calcium score 0, ordered for CCTA, not completed.  Today: Discussed the use of AI scribe software for clinical note transcription with the patient, who gave verbal consent to proceed.  History of Present Illness   She reports no current cardiac concerns but has experienced 'flutters' in the past, which she attributes to high Synthroid dosage. She expresses a desire to ensure her heart health due to her age and weight. She reports recent thyroid testing due to a perceived imbalance in her thyroid levels.  Previously, the patient wore a heart monitor due to these 'flutters,' but no significant findings were reported. She also reports difficulty distinguishing between cardiac symptoms and GERD symptoms, which has caused some anxiety.   The patient is also seeking to increase her physical activity levels but is aware of the need for a gradual increase to avoid potential cardiac strain. She enjoys walking and is aiming for 150 minutes per week. She is also attempting to follow a Mediterranean diet, although she admits to a preference for carbohydrates. She does not consume meat but does eat seafood.       ROS: Denies chest pain, shortness of breath at rest or with normal exertion. No PND, orthopnea, LE edema or unexpected weight gain. No  syncope or palpitations. ROS otherwise negative except as noted.   Studies Reviewed: Marland Kitchen    EKG:       Physical Exam:   VS:  BP 118/74   Pulse 79   Ht 5' 3.5" (1.613 m)   Wt 232 lb (105.2 kg)   SpO2 99%   BMI 40.45 kg/m    Wt Readings from Last 3 Encounters:  11/11/23 232 lb (105.2 kg)  05/20/23 229 lb 12.8 oz (104.2 kg)  04/29/21 232 lb 9.6 oz (105.5 kg)    GEN: Well nourished, well developed in no acute distress HEENT: Normal, moist mucous membranes NECK: No JVD CARDIAC: regular rhythm, normal S1 and S2, no rubs or gallops. No murmur. VASCULAR: Radial and DP pulses 2+ bilaterally. No carotid bruits RESPIRATORY:  Clear to auscultation without rales, wheezing or rhonchi  ABDOMEN: Soft, non-tender, non-distended MUSCULOSKELETAL:  Ambulates independently SKIN: Warm and dry, no edema NEUROLOGIC:  Alert and oriented x 3. No focal neuro deficits noted. PSYCHIATRIC:  Normal affect    ASSESSMENT AND PLAN: .    Palpitations: Monitor showed 3 runs of nonsustained SVT, longest 16 beats and rare PVCs. Patient triggered events mainly correlated with PVCs. No other significant arrhythmias or pauses -Monitor reassuring with no significant arrhythmias -much better controlled, notices that she has symptoms when her thyroid seems off but otherwise no issues   Thyroid Cancer s/p Thyroidectomy and XRT: -Follow-up with endocrinology as scheduled   Elevated LDL: Ca score 0. Working on lifestyle modifications.  -she is Ambulance person, working on minimizing  Obesity -BMI 40 -discussed lifestyle  goals today  CV risk counseling and prevention -recommend heart healthy/Mediterranean diet, with whole grains, fruits, vegetable, fish, lean meats, nuts, and olive oil. Limit salt. -recommend moderate walking, 3-5 times/week for 30-50 minutes each session. Aim for at least 150 minutes.week. Goal should be pace of 3 miles/hours, or walking 1.5 miles in 30 minutes -recommend avoidance of tobacco  products. Avoid excess alcohol.  Dispo: 1 year or sooner as needed  Signed, Jodelle Red, MD   Jodelle Red, MD, PhD, [facility]  [facility]  [place]  [facility] at [facility] at [facility] [address], [address] [place], [place] [address] [phone]

## 2023-11-11 ENCOUNTER — Ambulatory Visit (HOSPITAL_BASED_OUTPATIENT_CLINIC_OR_DEPARTMENT_OTHER): Payer: Commercial Managed Care - PPO | Admitting: Cardiology

## 2023-11-11 ENCOUNTER — Encounter (HOSPITAL_BASED_OUTPATIENT_CLINIC_OR_DEPARTMENT_OTHER): Payer: Self-pay | Admitting: Cardiology

## 2023-11-11 VITALS — BP 118/74 | HR 79 | Ht 63.5 in | Wt 232.0 lb

## 2023-11-11 DIAGNOSIS — Z85850 Personal history of malignant neoplasm of thyroid: Secondary | ICD-10-CM

## 2023-11-11 DIAGNOSIS — E7800 Pure hypercholesterolemia, unspecified: Secondary | ICD-10-CM | POA: Diagnosis not present

## 2023-11-11 DIAGNOSIS — R002 Palpitations: Secondary | ICD-10-CM

## 2023-11-11 DIAGNOSIS — Z7189 Other specified counseling: Secondary | ICD-10-CM

## 2023-11-11 NOTE — Patient Instructions (Signed)

## 2024-04-04 ENCOUNTER — Other Ambulatory Visit: Payer: Self-pay | Admitting: *Deleted

## 2024-04-04 DIAGNOSIS — I8393 Asymptomatic varicose veins of bilateral lower extremities: Secondary | ICD-10-CM

## 2024-04-06 ENCOUNTER — Ambulatory Visit (INDEPENDENT_AMBULATORY_CARE_PROVIDER_SITE_OTHER): Admitting: Physician Assistant

## 2024-04-06 ENCOUNTER — Ambulatory Visit (HOSPITAL_COMMUNITY)
Admission: RE | Admit: 2024-04-06 | Discharge: 2024-04-06 | Disposition: A | Discharge status: Home / Self Care | Source: Ambulatory Visit | Attending: Vascular Surgery | Admitting: Vascular Surgery

## 2024-04-06 VITALS — BP 148/92 | HR 72 | Temp 98.0°F | Ht 63.0 in | Wt 237.6 lb

## 2024-04-06 DIAGNOSIS — I8393 Asymptomatic varicose veins of bilateral lower extremities: Secondary | ICD-10-CM

## 2024-04-06 DIAGNOSIS — I83813 Varicose veins of bilateral lower extremities with pain: Secondary | ICD-10-CM

## 2024-04-06 DIAGNOSIS — I872 Venous insufficiency (chronic) (peripheral): Secondary | ICD-10-CM

## 2024-04-06 NOTE — Progress Notes (Signed)
 Requested by:  Fatima Sanger, FNP [address] [place],  [place] [address]  Reason for consultation: varicose veins    History of Present Illness   Brianna Mcintyre is a 68 y.o. (03-18-1956) female who presents for evaluation of varicose veins.  The patient says that she first started developing varicose veins and spider veins in her left leg in her 30s.  She has developed more over time.  She says very occasionally her varicose veins around the left knee will cause a stabbing pain.  Typically this pain is about a 2 out of 10 and not very bothersome.  She denies any aching, heaviness, or tiredness of the legs.  She denies any significant bilateral lower extremity swelling.  She denies any prior history of DVT or previous vein procedures.  She does not wear compression stockings.  She says that she elevates her legs at night on an ottoman.  Past Medical History:  Diagnosis Date   Allergy    seasonal   GERD (gastroesophageal reflux disease)    History of colon polyps    Thyroid cancer (HCC)     Past Surgical History:  Procedure Laterality Date   COLONOSCOPY     THYROID SURGERY  1993   UPPER GI ENDOSCOPY      Social History   Socioeconomic History   Marital status: Divorced    Spouse name: Not on file   Number of children: 4   Years of education: Not on file   Highest education level: Not on file  Occupational History   Occupation: DIRECTOR OF SALES    Employer: [employer]  Tobacco Use   Smoking status: Never   Smokeless tobacco: Never  Vaping Use   Vaping status: Never Used  Substance and Sexual Activity   Alcohol use: Yes    Comment: rarely   Drug use: No   Sexual activity: Not on file  Other Topics Concern   Not on file  Social History Narrative   Not on file   Social Drivers of Health   Financial Resource Strain: Not on file  Food Insecurity: Not on file  Transportation Needs: Not on file  Physical Activity: Not on file  Stress: Not on  file  Social Connections: Not on file  Intimate Partner Violence: Not on file    Family History  Problem Relation Age of Onset   Thyroid cancer Mother    Heart disease Mother    Colon cancer Neg Hx    Esophageal cancer Neg Hx    Pancreatic cancer Neg Hx    Rectal cancer Neg Hx    Stomach cancer Neg Hx     Current Outpatient Medications  Medication Sig Dispense Refill   albuterol (VENTOLIN HFA) 108 (90 Base) MCG/ACT inhaler as needed.     Cholecalciferol (VITAMIN D3) 50 MCG (2000 UT) TABS Take 1 tablet by mouth daily.     famotidine (PEPCID) 20 MG tablet Take 25 mg by mouth as needed for heartburn or indigestion.     ibuprofen (ADVIL) 200 MG tablet Take 200 mg by mouth as needed.     levothyroxine (SYNTHROID, LEVOTHROID) 125 MCG tablet Take 100 mcg by mouth daily before breakfast.      MAGNESIUM PO Take by mouth. Takes occassionally     No current facility-administered medications for this visit.    Allergies  Allergen Reactions   Clarithromycin Nausea Only   Codeine     REACTION: nausea    REVIEW  OF SYSTEMS (negative unless checked):   Cardiac:  []  Chest pain or chest pressure? []  Shortness of breath upon activity? []  Shortness of breath when lying flat? []  Irregular heart rhythm?  Vascular:  []  Pain in calf, thigh, or hip brought on by walking? []  Pain in feet at night that wakes you up from your sleep? []  Blood clot in your veins? []  Leg swelling?  Pulmonary:  []  Oxygen at home? []  Productive cough? []  Wheezing?  Neurologic:  []  Sudden weakness in arms or legs? []  Sudden numbness in arms or legs? []  Sudden onset of difficult speaking or slurred speech? []  Temporary loss of vision in one eye? []  Problems with dizziness?  Gastrointestinal:  []  Blood in stool? []  Vomited blood?  Genitourinary:  []  Burning when urinating? []  Blood in urine?  Psychiatric:  []  Major depression  Hematologic:  []  Bleeding problems? []  Problems with blood  clotting?  Dermatologic:  []  Rashes or ulcers?  Constitutional:  []  Fever or chills?  Ear/Nose/Throat:  []  Change in hearing? []  Nose bleeds? []  Sore throat?  Musculoskeletal:  []  Back pain? []  Joint pain? []  Muscle pain?   Physical Examination    There were no vitals filed for this visit. There is no height or weight on file to calculate BMI.  General:  WDWN in NAD; vital signs documented above Gait: Not observed HENT: WNL, normocephalic Pulmonary: normal non-labored breathing , without Rales, rhonchi,  wheezing Cardiac: regular Abdomen: soft, NT, no masses Skin: without rashes Vascular Exam/Pulses: 2+ DP pulses Extremities: LLE with a handful of spider and reticular veins on the thigh and shin. Several small varicosities above and beside the left knee.  No edema, stasis pigmentation, or ulcerations Musculoskeletal: no muscle wasting or atrophy  Neurologic: A&O X 3;  No focal weakness or paresthesias are detected Psychiatric:  The pt has Normal affect.  Non-invasive Vascular Imaging   LLE Venous Insufficiency Duplex (04/06/2024):  +--------------+---------+------+-----------+------------+--------+  LEFT         Reflux NoRefluxReflux TimeDiameter cmsComments                          Yes                                   +--------------+---------+------+-----------+------------+--------+  CFV          no                                              +--------------+---------+------+-----------+------------+--------+  FV mid        no                                              +--------------+---------+------+-----------+------------+--------+  Popliteal    no                                              +--------------+---------+------+-----------+------------+--------+  GSV at [facility]    no  0.74              +--------------+---------+------+-----------+------------+--------+  GSV prox thighno                             0.39              +--------------+---------+------+-----------+------------+--------+  GSV mid thigh no                            0.29              +--------------+---------+------+-----------+------------+--------+  GSV dist thighno                            0.33              +--------------+---------+------+-----------+------------+--------+  GSV at knee   no                            0.36              +--------------+---------+------+-----------+------------+--------+  GSV prox calf           yes    >500 ms      0.21              +--------------+---------+------+-----------+------------+--------+  SSV Pop Fossa no                            0.23              +--------------+---------+------+-----------+------------+--------+  SSV prox calf no                            0.18              +--------------+---------+------+-----------+------------+--------+  SSV mid calf  no                            0.2               +--------------+---------+------+-----------+------------+--------+    Medical Decision Making   ANAVEY COOMBES is a 68 y.o. female who presents for evaluation of varicose veins  Based on the patient's duplex, there is reflux in the left greater saphenous vein at the proximal calf.  The remainder of the patient's deep and superficial venous system is competent.  There is no evidence of DVT or SVT on exam.  The patient would not be a candidate for vein ablation, given that her GSV is competent throughout the thigh. The patient endorses developing varicose and spider veins in her legs for at least 30 years now.  She has developed more over time, however they are rarely bothersome to her.  She denies any issues with lower extremity swelling, aching, or tiredness On exam she has a handful of spider and reticular veins around the left distal thigh and shin.  There are several small varicose veins above the  left knee and lateral to it.  She has no lower extremity swelling. I have explained to the patient that she has no evidence of DVT on ultrasound.  I have also reassured her that her varicose and spider veins do not put her life at risk or increase her risk for limb loss.  I  have encouraged her that conservative treatment of venous insufficiency will help decrease her symptoms within her varicose veins. I have recommended that she try to elevate her legs above her heart for at least 10 to 15 minutes nightly.  I have also recommended that she exercise regularly.  Overall she is fairly asymptomatic and does not require any treatment of her veins. She can follow-up with our office as needed   Ron Cobbs, PA-C [facility] Office: [phone]  04/06/2024, 1:52 PM  Clinic MD: Rosalva Comber

## 2024-04-27 ENCOUNTER — Other Ambulatory Visit: Payer: Self-pay | Admitting: Registered Nurse

## 2024-04-27 ENCOUNTER — Ambulatory Visit: Admitting: Physician Assistant

## 2024-04-27 DIAGNOSIS — G43109 Migraine with aura, not intractable, without status migrainosus: Secondary | ICD-10-CM

## 2024-04-28 ENCOUNTER — Encounter: Payer: Self-pay | Admitting: Registered Nurse

## 2024-05-01 ENCOUNTER — Ambulatory Visit
Admission: RE | Admit: 2024-05-01 | Discharge: 2024-05-01 | Discharge status: Home / Self Care | Source: Ambulatory Visit | Attending: Registered Nurse | Admitting: Registered Nurse

## 2024-05-01 DIAGNOSIS — G43109 Migraine with aura, not intractable, without status migrainosus: Secondary | ICD-10-CM

## 2024-05-18 ENCOUNTER — Ambulatory Visit: Admitting: Physician Assistant

## 2024-08-16 ENCOUNTER — Ambulatory Visit (INDEPENDENT_AMBULATORY_CARE_PROVIDER_SITE_OTHER): Admitting: Neurology

## 2024-08-16 ENCOUNTER — Encounter: Payer: Self-pay | Admitting: Neurology

## 2024-08-16 VITALS — BP 148/86 | HR 82 | Ht 63.5 in | Wt 237.4 lb

## 2024-08-16 DIAGNOSIS — G43109 Migraine with aura, not intractable, without status migrainosus: Secondary | ICD-10-CM | POA: Insufficient documentation

## 2024-08-16 MED ORDER — NURTEC 75 MG PO TBDP: 75.0000 mg | ORAL_TABLET | Freq: Every day | ORAL | 11 refills | Status: AC | PRN

## 2024-08-16 NOTE — Patient Instructions (Addendum)
 Nurtec as needed Vitamin D supplementation Magnesium for migraine with aura  Rimegepant Disintegrating Tablets What is this medication? RIMEGEPANT (ri ME je pant) prevents and treats migraines. It works by blocking a substance in the body that causes migraines. This medicine may be used for other purposes; ask your health care provider or pharmacist if you have questions. COMMON BRAND NAME(S): NURTEC ODT What should I tell my care team before I take this medication? They need to know if you have any of these conditions: Circulation problems in fingers or toes (Raynaud syndrome) High blood pressure Kidney disease Liver disease An unusual or allergic reaction to rimegepant, other medications, foods, dyes, or preservatives Pregnant or trying to get pregnant Breastfeeding How should I use this medication? Take this medication by mouth. Take it as directed on the prescription label. Leave the tablet in the sealed pack until you are ready to take it. With dry hands, open the pack and gently remove the tablet. If the tablet breaks or crumbles, throw it away. Use a new tablet. Place the tablet in the mouth and allow it to dissolve. Then, swallow it. Do not cut, crush, or chew this medication. You do not need water to take this medication. Talk to your care team about the use of this medication in children. Special care may be needed. Overdosage: If you think you have taken too much of this medicine contact a poison control center or emergency room at once. NOTE: This medicine is only for you. Do not share this medicine with others. What if I miss a dose? This does not apply. This medication is not for regular use. What may interact with this medication? Certain medications for fungal infections, such as fluconazole, itraconazole Rifampin This list may not describe all possible interactions. Give your health care provider a list of all the medicines, herbs, non-prescription drugs, or dietary  supplements you use. Also tell them if you smoke, drink alcohol, or use illegal drugs. Some items may interact with your medicine. What should I watch for while using this medication? Visit your care team for regular checks on your progress. Tell your care team if your symptoms do not start to get better or if they get worse. What side effects may I notice from receiving this medication? Side effects that you should report to your care team as soon as possible: Allergic reactions or angioedema--skin rash, itching or hives, swelling of the face, eyes, lips, tongue, arms, or legs, trouble swallowing or breathing Increase in blood pressure Raynaud syndrome--cool, numb, or painful fingers or toes that may change color from pale, to blue, to red Side effects that usually do not require medical attention (report these to your care team if they continue or are bothersome): Nausea Stomach pain This list may not describe all possible side effects. Call your doctor for medical advice about side effects. You may report side effects to FDA at 1-800-FDA-1088. Where should I keep my medication? Keep out of the reach of children and pets. Store at room temperature between 20 and 25 degrees C (68 and 77 degrees F). Get rid of any unused medication after the expiration date. To get rid of medications that are no longer needed or have expired: Take the medication to a medication take-back program. Check with your pharmacy or law enforcement to find a location. If you cannot return the medication, check the label or package insert to see if the medication should be thrown out in the garbage or flushed down the  toilet. If you are not sure, ask your care team. If it is safe to put it in the trash, take the medication out of the container. Mix the medication with cat litter, dirt, coffee grounds, or other unwanted substance. Seal the mixture in a bag or container. Put it in the trash. NOTE: This sheet is a summary. It  may not cover all possible information. If you have questions about this medicine, talk to your doctor, pharmacist, or health care provider.  2025 Elsevier/Gold Standard (2024-03-16 00:00:00)
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
## 2024-08-17 ENCOUNTER — Telehealth: Payer: Self-pay

## 2024-08-17 ENCOUNTER — Other Ambulatory Visit (HOSPITAL_COMMUNITY): Payer: Self-pay

## 2024-08-17 NOTE — Telephone Encounter (Signed)
 Pharmacy Patient Advocate Encounter   Received notification from Fax that prior authorization for Nurtec 75mg  ODT is required/requested.   Insurance verification completed.   The patient is insured through [facility] .   Per test claim: PA required; PA submitted to above mentioned insurance via Latent Key/confirmation #/EOC [id number] Status is pending

## 2024-08-17 NOTE — Telephone Encounter (Signed)
 Pharmacy Patient Advocate Encounter  Received notification from [facility] that Prior Authorization for Nurtec 75mg  Tablet has been APPROVED from 08/17/2024 to 11/17/2024   PA #/Case ID/Reference #: [id number]

## 2024-09-25 ENCOUNTER — Other Ambulatory Visit: Payer: Self-pay | Admitting: Obstetrics and Gynecology

## 2024-09-25 DIAGNOSIS — Z1231 Encounter for screening mammogram for malignant neoplasm of breast: Secondary | ICD-10-CM

## 2024-10-03 ENCOUNTER — Encounter: Payer: Self-pay | Admitting: Pediatrics

## 2024-10-04 ENCOUNTER — Ambulatory Visit
Admission: RE | Admit: 2024-10-04 | Discharge: 2024-10-04 | Disposition: A | Discharge status: Home / Self Care | Source: Ambulatory Visit | Attending: Obstetrics and Gynecology | Admitting: Obstetrics and Gynecology

## 2024-10-04 DIAGNOSIS — Z1231 Encounter for screening mammogram for malignant neoplasm of breast: Secondary | ICD-10-CM

## 2024-10-16 ENCOUNTER — Ambulatory Visit (AMBULATORY_SURGERY_CENTER)

## 2024-10-16 VITALS — Ht 63.5 in | Wt 230.2 lb

## 2024-10-16 DIAGNOSIS — Z860100 Personal history of colon polyps, unspecified: Secondary | ICD-10-CM

## 2024-10-16 MED ORDER — NA SULFATE-K SULFATE-MG SULF 17.5-3.13-1.6 GM/177ML PO SOLN: 1.0000 | Freq: Once | ORAL | 0 refills | Status: AC

## 2024-10-16 NOTE — Progress Notes (Signed)

## 2024-10-31 ENCOUNTER — Telehealth: Payer: Self-pay | Admitting: Pediatrics

## 2024-10-31 ENCOUNTER — Encounter: Payer: Self-pay | Admitting: Pediatrics

## 2024-10-31 NOTE — Telephone Encounter (Signed)
 Inbound call from patient stating she is scheduled to have a  colonoscopy on 11/17 at 8:30 and is requesting a call back from [employer] regarding pre auth. Please advise.

## 2024-11-05 NOTE — Progress Notes (Unsigned)
 [facility] History and Physical   Primary Care Physician:  Royden Ronal Czar, FNP   Reason for Procedure:  Surveillance of adenomatous colon polyps  Plan:    Colonoscopy   The patient was provided an opportunity to ask questions and all were answered. The patient agreed with the plan.   HPI: Brianna Mcintyre is a 68 y.o. female undergoing colonoscopy for surveillance of adenomatous colon polyps.  Colonoscopy 2010-removal of rectal polyp-adenoma with high-grade dysplasia Colonoscopy 2011-hyperplastic polyps Colonoscopy 2015- 1 nonadvanced tubular adenoma Colonoscopy 2020- 1 nonadvanced tubular adenoma  Patient denies current symptoms of change in bowel habits or rectal bleeding No documented family history of colorectal cancer or colon polyps   Past Medical History:  Diagnosis Date   Allergy    seasonal   GERD (gastroesophageal reflux disease)    History of colon polyps    Thyroid  cancer [facility])     Past Surgical History:  Procedure Laterality Date   COLONOSCOPY     THYROID  SURGERY  1993   UPPER GI ENDOSCOPY      Prior to Admission medications   Medication Sig Start Date End Date Taking? Authorizing Provider  Cholecalciferol (VITAMIN D3) 50 MCG (2000 UT) TABS Take 1 tablet by mouth daily. 08/21/20   [provider]  famotidine (PEPCID) 20 MG tablet Take 25 mg by mouth as needed for heartburn or indigestion.    [provider]  ibuprofen (ADVIL) 200 MG tablet Take 200 mg by mouth as needed.    [provider]  levothyroxine (SYNTHROID, LEVOTHROID) 125 MCG tablet Take 125 mcg by mouth daily before breakfast.    [provider]  Probiotic Product (PROBIOTIC ADVANCED PO) Take 1 capsule by mouth daily.    [provider]  Rimegepant Sulfate (NURTEC) 75 MG TBDP Take 1 tablet (75 mg total) by mouth daily as needed. For migraines. Take as close to onset of migraine as possible. One daily maximum.Please run copay card: [facility]  [id number] PCN CN GRP [id number] ID [id number] EXP 12/20/2024 Patient not taking: Reported on 10/16/2024 08/16/24   Ines Onetha NOVAK, MD    Current Outpatient Medications  Medication Sig Dispense Refill   Cholecalciferol (VITAMIN D3) 50 MCG (2000 UT) TABS Take 1 tablet by mouth daily.     levothyroxine (SYNTHROID, LEVOTHROID) 125 MCG tablet Take 125 mcg by mouth daily before breakfast.     Na Sulfate-K Sulfate-Mg Sulfate concentrate (SUPREP) 17.5-3.13-1.6 GM/177ML SOLN SMARTSIG:1 Kit(s) By Mouth Once     Probiotic Product (PROBIOTIC ADVANCED PO) Take 1 capsule by mouth daily.     famotidine (PEPCID) 20 MG tablet Take 25 mg by mouth as needed for heartburn or indigestion.     ibuprofen (ADVIL) 200 MG tablet Take 200 mg by mouth as needed.     Rimegepant Sulfate (NURTEC) 75 MG TBDP Take 1 tablet (75 mg total) by mouth daily as needed. For migraines. Take as close to onset of migraine as possible. One daily maximum.Please run copay card: [facility] [id number] PCN CN GRP [id number] ID [id number] EXP 12/20/2024 (Patient not taking: Reported on 10/16/2024) 16 tablet 11   Current Facility-Administered Medications  Medication Dose Route Frequency Provider Last Rate Last Admin   0.9 %  sodium chloride  infusion  500 mL Intravenous Once Adaleah Forget M, MD        Allergies as of 11/06/2024 - Review Complete 11/06/2024  Allergen Reaction Noted   Oyster shell Anaphylaxis 10/16/2024   Clarithromycin Nausea Only 02/02/2013   Codeine Nausea Only  02/26/2010   Milk-related compounds Diarrhea 10/16/2024    Family History  Problem Relation Age of Onset   Thyroid  cancer Mother    Heart disease Mother    Colon cancer Neg Hx    Esophageal cancer Neg Hx    Pancreatic cancer Neg Hx    Rectal cancer Neg Hx    Stomach cancer Neg Hx    Migraines Neg Hx    Breast cancer Neg Hx     Social History   Socioeconomic History   Marital status: Divorced    Spouse name: Not on file   Number of children: 4   Years of  education: Not on file   Highest education level: Not on file  Occupational History   Occupation: DIRECTOR OF Investment Banker, Corporate: [employer]  Tobacco Use   Smoking status: Never   Smokeless tobacco: Never  Vaping Use   Vaping status: Never Used  Substance and Sexual Activity   Alcohol use: Yes    Comment: rarely   Drug use: No   Sexual activity: Not Currently    Birth control/protection: Post-menopausal  Other Topics Concern   Not on file  Social History Narrative   Caffiene 1 cups daily   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 [employer]   Live with 2 kids, 1 dog   Social Drivers of Corporate Investment Banker Strain: Not on file  Food Insecurity: Not on file  Transportation Needs: Not on file  Physical Activity: Not on file  Stress: Not on file  Social Connections: Not on file  Intimate Partner Violence: Not on file    Review of Systems:  All other review of systems negative except as mentioned in the HPI.  Physical Exam: Vital signs BP 122/76   Pulse 82   Temp (!) 97.5 F (36.4 C) (Temporal)   Ht 5' 3.5 (1.613 m)   Wt 230 lb 8 oz (104.6 kg)   SpO2 97%   BMI 40.19 kg/m   General:   Alert,  Well-developed, well-nourished, pleasant and cooperative in NAD Airway:  Mallampati 1 Lungs:  Clear throughout to auscultation.   Heart:  Regular rate and rhythm; no murmurs, clicks, rubs,  or gallops. Abdomen:  Soft, nontender and nondistended. Normal bowel sounds.   Neuro/Psych:  Normal mood and affect. A and O x 3  Inocente Hausen, MD [facility]

## 2024-11-06 ENCOUNTER — Ambulatory Visit: Admitting: Pediatrics

## 2024-11-06 ENCOUNTER — Encounter: Payer: Self-pay | Admitting: Pediatrics

## 2024-11-06 VITALS — BP 147/85 | HR 67 | Temp 97.5°F | Resp 15 | Ht 63.5 in | Wt 230.5 lb

## 2024-11-06 DIAGNOSIS — Z1211 Encounter for screening for malignant neoplasm of colon: Secondary | ICD-10-CM

## 2024-11-06 DIAGNOSIS — K648 Other hemorrhoids: Secondary | ICD-10-CM | POA: Diagnosis not present

## 2024-11-06 DIAGNOSIS — D122 Benign neoplasm of ascending colon: Secondary | ICD-10-CM

## 2024-11-06 DIAGNOSIS — Z860101 Personal history of adenomatous and serrated colon polyps: Secondary | ICD-10-CM

## 2024-11-06 DIAGNOSIS — Z860100 Personal history of colon polyps, unspecified: Secondary | ICD-10-CM

## 2024-11-06 DIAGNOSIS — D120 Benign neoplasm of cecum: Secondary | ICD-10-CM | POA: Diagnosis not present

## 2024-11-06 DIAGNOSIS — D128 Benign neoplasm of rectum: Secondary | ICD-10-CM | POA: Diagnosis not present

## 2024-11-06 DIAGNOSIS — K5730 Diverticulosis of large intestine without perforation or abscess without bleeding: Secondary | ICD-10-CM

## 2024-11-06 DIAGNOSIS — K635 Polyp of colon: Secondary | ICD-10-CM | POA: Diagnosis not present

## 2024-11-06 MED ORDER — SODIUM CHLORIDE 0.9 % IV SOLN: 500.0000 mL | Freq: Once | INTRAVENOUS | Status: DC

## 2024-11-06 NOTE — Op Note (Signed)
 [facility] Patient Name: Brianna Mcintyre Procedure Date: 11/06/2024 9:32 AM MRN: [id number] Endoscopist: Inocente Hausen , MD, [id number] Age: 68 Referring MD:  Date of Birth: Jan 11, 1956 Gender: Female Account #: [id number] Procedure:                Colonoscopy Indications:              High risk colon cancer surveillance: Personal                            history of adenoma with high grade dysplasia, High                            risk colon cancer surveillance: Personal history of                            non-advanced adenoma, Last colonoscopy: August                            2020; history of adenomatous polyp with high-grade                            dysplasia in 2010; colonoscopy is 2015, 2020 showed                            single nonadvanced tubular adenomas. Medicines:                Monitored Anesthesia Care Procedure:                Pre-Anesthesia Assessment:                           - Prior to the procedure, a History and Physical                            was performed, and patient medications and                            allergies were reviewed. The patient's tolerance of                            previous anesthesia was also reviewed. The risks                            and benefits of the procedure and the sedation                            options and risks were discussed with the patient.                            All questions were answered, and informed consent                            was obtained. Prior Anticoagulants: The patient has  taken no anticoagulant or antiplatelet agents. ASA                            Grade Assessment: II - A patient with mild systemic                            disease. After reviewing the risks and benefits,                            the patient was deemed in satisfactory condition to                            undergo the procedure.                           - Prior to the  procedure, a History and Physical                            was performed, and patient medications and                            allergies were reviewed. The patient's tolerance of                            previous anesthesia was also reviewed. The risks                            and benefits of the procedure and the sedation                            options and risks were discussed with the patient.                            All questions were answered, and informed consent                            was obtained. Prior Anticoagulants: The patient has                            taken no anticoagulant or antiplatelet agents. ASA                            Grade Assessment: II - A patient with mild systemic                            disease. After reviewing the risks and benefits,                            the patient was deemed in satisfactory condition to                            undergo the procedure.  After obtaining informed consent, the colonoscope                            was passed under direct vision. Throughout the                            procedure, the patient's blood pressure, pulse, and                            oxygen saturations were monitored continuously. The                            Olympus CF-HQ190L ([id number]) Colonoscope was                            introduced through the anus and advanced to the                            cecum, identified by appendiceal orifice and                            ileocecal valve. The colonoscopy was performed                            without difficulty. The patient tolerated the                            procedure well. The quality of the bowel                            preparation was good. The ileocecal valve,                            appendiceal orifice, and rectum were photographed. Scope In: 9:48:08 AM Scope Out: 10:06:52 AM Scope Withdrawal Time: 0 hours 12 minutes 5 seconds  Total  Procedure Duration: 0 hours 18 minutes 44 seconds  Findings:                 The perianal and digital rectal examinations were                            normal. Pertinent negatives include normal                            sphincter tone and no palpable rectal lesions.                           A few small-mouthed diverticula were found in the                            sigmoid colon.                           A 5 mm polyp was found in the cecum. The polyp was  sessile. The polyp was removed with a cold snare.                            Resection and retrieval were complete.                           Three sessile polyps were found in the rectum and                            ascending colon. The polyps were 3 to 4 mm in size.                            These polyps were removed with a cold biopsy                            forceps. Resection and retrieval were complete.                           Internal hemorrhoids were found during retroflexion. Complications:            No immediate complications. Estimated blood loss:                            Minimal. Estimated Blood Loss:     Estimated blood loss was minimal. Impression:               - Diverticulosis in the sigmoid colon.                           - One 5 mm polyp in the cecum, removed with a cold                            snare. Resected and retrieved.                           - Three 3 to 4 mm polyps in the rectum and in the                            ascending colon, removed with a cold biopsy                            forceps. Resected and retrieved.                           - Internal hemorrhoids. Recommendation:           - Discharge patient to home (ambulatory).                           - Await pathology results.                           - Repeat colonoscopy for surveillance based on  pathology results.                           - The findings and recommendations  were discussed                            with the patient's family.                           - Return to referring physician.                           - Patient has a contact number available for                            emergencies. The signs and symptoms of potential                            delayed complications were discussed with the                            patient. Return to normal activities tomorrow.                            Written discharge instructions were provided to the                            patient. Inocente Hausen, MD 11/06/2024 10:12:34 AM This report has been signed electronically.

## 2024-11-06 NOTE — Patient Instructions (Signed)
 YOU HAD AN ENDOSCOPIC PROCEDURE TODAY AT THE [facility]:   Refer to the procedure report that was given to you for any specific questions about what was found during the examination.  If the procedure report does not answer your questions, please call your gastroenterologist to clarify.  If you requested that your care partner not be given the details of your procedure findings, then the procedure report has been included in a sealed envelope for you to review at your convenience later.  YOU SHOULD EXPECT: Some feelings of bloating in the abdomen. Passage of more gas than usual.  Walking can help get rid of the air that was put into your GI tract during the procedure and reduce the bloating. If you had a lower endoscopy (such as a colonoscopy or flexible sigmoidoscopy) you may notice spotting of blood in your stool or on the toilet paper. If you underwent a bowel prep for your procedure, you may not have a normal bowel movement for a few days.  Please Note:  You might notice some irritation and congestion in your nose or some drainage.  This is from the oxygen used during your procedure.  There is no need for concern and it should clear up in a day or so.  SYMPTOMS TO REPORT IMMEDIATELY:  Following lower endoscopy (colonoscopy or flexible sigmoidoscopy):  Excessive amounts of blood in the stool  Significant tenderness or worsening of abdominal pains  Swelling of the abdomen that is new, acute  Fever of 100F or higher  Await pathology results Discharge to home Return to referring physician Handout on polyps given   For urgent or emergent issues, a gastroenterologist can be reached at any hour by calling [phone]. Do not use MyChart messaging for urgent concerns.    DIET:  We do recommend a small meal at first, but then you may proceed to your regular diet.  Drink plenty of fluids but you should avoid alcoholic beverages for 24 hours.  ACTIVITY:  You should plan to take  it easy for the rest of today and you should NOT DRIVE or use heavy machinery until tomorrow (because of the sedation medicines used during the test).    FOLLOW UP: Our staff will call the number listed on your records the next business day following your procedure.  We will call around 7:15- 8:00 am to check on you and address any questions or concerns that you may have regarding the information given to you following your procedure. If we do not reach you, we will leave a message.     If any biopsies were taken you will be contacted by phone or by letter within the next 1-3 weeks.  Please call us  at [phone] if you have not heard about the biopsies in 3 weeks.    SIGNATURES/CONFIDENTIALITY: You and/or your care partner have signed paperwork which will be entered into your electronic medical record.  These signatures attest to the fact that that the information above on your After Visit Summary has been reviewed and is understood.  Full responsibility of the confidentiality of this discharge information lies with you and/or your care-partner.

## 2024-11-06 NOTE — Progress Notes (Signed)
 Transferred to PACU via stretcher. Patient arousing to stimulation.  VSS upon leaving procedure room.

## 2024-11-07 ENCOUNTER — Telehealth: Payer: Self-pay

## 2024-11-07 NOTE — Telephone Encounter (Signed)
  Follow up Call-     11/06/2024    8:56 AM  Call back number  Post procedure Call Back phone  # [phone]  Permission to leave phone message Yes     Patient questions:  Do you have a fever, pain , or abdominal swelling? Yes. Mild stomach cramping relieved by passing gas. Pain Score  3  Have you tolerated food without any problems? Yes.  Have you been able to return to your normal activities? Yes.  Do you have any questions about your discharge instructions: Diet   No. Medications  No. Follow up visit  No.  Do you have questions or concerns about your Care? No.  Actions: * If pain score is 4 or above: No action needed, pain <4.

## 2024-11-08 LAB — SURGICAL PATHOLOGY

## 2024-11-09 ENCOUNTER — Ambulatory Visit: Payer: Self-pay | Admitting: Pediatrics
